# Patient Record
Sex: Female | Born: 1985 | Race: White | Hispanic: No | Marital: Married | Smoking: Former smoker
Health system: Southern US, Community
[De-identification: ages and names within clinical notes are randomized; demographics above are authoritative.]

## PROBLEM LIST (undated history)

## (undated) DIAGNOSIS — K8020 Calculus of gallbladder without cholecystitis without obstruction: Secondary | ICD-10-CM

## (undated) DIAGNOSIS — Z800 Family history of malignant neoplasm of digestive organs: Secondary | ICD-10-CM

## (undated) DIAGNOSIS — F329 Major depressive disorder, single episode, unspecified: Secondary | ICD-10-CM

## (undated) DIAGNOSIS — Z1379 Encounter for other screening for genetic and chromosomal anomalies: Principal | ICD-10-CM

## (undated) DIAGNOSIS — F32A Depression, unspecified: Secondary | ICD-10-CM

## (undated) DIAGNOSIS — Z87442 Personal history of urinary calculi: Secondary | ICD-10-CM

## (undated) DIAGNOSIS — E538 Deficiency of other specified B group vitamins: Secondary | ICD-10-CM

## (undated) HISTORY — DX: Major depressive disorder, single episode, unspecified: F32.9

## (undated) HISTORY — PX: WISDOM TOOTH EXTRACTION: SHX21

## (undated) HISTORY — DX: Encounter for other screening for genetic and chromosomal anomalies: Z13.79

## (undated) HISTORY — DX: Personal history of urinary calculi: Z87.442

## (undated) HISTORY — DX: Family history of malignant neoplasm of digestive organs: Z80.0

## (undated) HISTORY — DX: Depression, unspecified: F32.A

## (undated) HISTORY — DX: Calculus of gallbladder without cholecystitis without obstruction: K80.20

---

## 2007-02-14 ENCOUNTER — Inpatient Hospital Stay (HOSPITAL_COMMUNITY): Admission: AD | Admit: 2007-02-14 | Discharge: 2007-02-14 | Payer: Self-pay | Admitting: Obstetrics and Gynecology

## 2007-05-01 ENCOUNTER — Inpatient Hospital Stay (HOSPITAL_COMMUNITY): Admission: AD | Admit: 2007-05-01 | Discharge: 2007-05-03 | Payer: Self-pay | Admitting: Obstetrics and Gynecology

## 2010-07-10 ENCOUNTER — Emergency Department (HOSPITAL_COMMUNITY): Admission: EM | Admit: 2010-07-10 | Discharge: 2010-07-10 | Payer: Self-pay | Admitting: Emergency Medicine

## 2010-12-25 LAB — DIFFERENTIAL
Basophils Absolute: 0 10*3/uL (ref 0.0–0.1)
Lymphocytes Relative: 22 % (ref 12–46)
Monocytes Absolute: 0.9 10*3/uL (ref 0.1–1.0)
Monocytes Relative: 9 % (ref 3–12)
Neutro Abs: 6.4 10*3/uL (ref 1.7–7.7)
Neutrophils Relative %: 65 % (ref 43–77)

## 2010-12-25 LAB — URINALYSIS, ROUTINE W REFLEX MICROSCOPIC
Bilirubin Urine: NEGATIVE
Hgb urine dipstick: NEGATIVE
Ketones, ur: NEGATIVE mg/dL
Protein, ur: NEGATIVE mg/dL
Urobilinogen, UA: 0.2 mg/dL (ref 0.0–1.0)

## 2010-12-25 LAB — CBC
HCT: 37.3 % (ref 36.0–46.0)
Hemoglobin: 12.3 g/dL (ref 12.0–15.0)
WBC: 9.8 10*3/uL (ref 4.0–10.5)

## 2010-12-25 LAB — PREGNANCY, URINE: Preg Test, Ur: NEGATIVE

## 2010-12-25 LAB — BASIC METABOLIC PANEL
Calcium: 9.2 mg/dL (ref 8.4–10.5)
GFR calc Af Amer: >60 mL/min (ref >60)
GFR calc non Af Amer: >60 mL/min (ref >60)
Potassium: 3.6 mEq/L (ref 3.5–5.1)
Sodium: 140 mEq/L (ref 135–145)

## 2010-12-25 LAB — URINE MICROSCOPIC-ADD ON

## 2011-02-27 NOTE — Discharge Summary (Signed)
NAMESARAYAH, BACCHI                ACCOUNT NO.:  [id number]   MEDICAL RECORD NO.:  [id number]          PATIENT TYPE:  INP   LOCATION:  9147                          FACILITY:  [facility]   PHYSICIAN:  Zenaida Niece, M.D.DATE OF BIRTH:  June 17, 1986   DATE OF ADMISSION:  05/01/2007  DATE OF DISCHARGE:  05/03/2007                               DISCHARGE SUMMARY   ADMISSION DIAGNOSIS:  Intrauterine pregnancy at 39 weeks.   DISCHARGE DIAGNOSIS:  Intrauterine pregnancy at 39 weeks.   PROCEDURES:  On July 20, she had a spontaneous vaginal delivery.   HISTORY AND PHYSICAL:  This is a 25 year old white female gravida 2,  para 0-0-1-0 with an EGA of [redacted] weeks who presents with complaint of  regular contractions.  Evaluation in triage revealed cervix to be 3 cm  dilated with regular contractions.  Prenatal care essentially  uncomplicated.  She did briefly take Celexa for depression.   PRENATAL LABS:  Blood type is O negative with a negative antibody  screen, RPR nonreactive, rubella immune, hepatitis B surface antigen  negative, HIV negative, gonorrhea and chlamydia negative.  Quad screen  is normal, 1-hour Glucola 96, group B strep is negative.   PAST OB HISTORY:  One elective termination.   PAST MEDICAL HISTORY:  Depression.   PAST SURGICAL HISTORY:  Wisdom tooth removal.   PHYSICAL EXAM:  VITAL SIGNS:  She is afebrile with stable vital signs.  Fetal heart tracing reactive with contractions every 3 minutes.  ABDOMEN:  Gravid, nontender with an estimated fetal weight 7 pounds.  PELVIC:  Cervix on my first exam she is 7, complete and 0 with a vertex  presentation, adequate pelvis and amniotomy reveals clear fluid.   HOSPITAL COURSE:  The patient was admitted and continued to contract on  her own.  Amniotomy was done for augmentation.  She progressed to  complete, pushed well and on the afternoon of July 20 delivered a viable  female infant with Apgars of 9 and 9, weight 7 pounds 3 ounces.  Placenta delivered spontaneously intact and was sent for cord blood  collection.  She had bilateral labial and first-degree lacerations  repaired with 3-0 Vicryl and estimated blood loss was less than 500 mL.  Postpartum she had no significant complications.  Predelivery hemoglobin  11.3, postdelivery 10.2.  On postpartum #2 she was felt to be stable  enough for discharge home.   DISCHARGE INSTRUCTIONS:  Regular diet, pelvic rest, follow-up in 6  weeks.   DISCHARGE MEDICATIONS:  Over-the-counter ibuprofen as needed.   She is given our discharge pamphlet.      Zenaida Niece, M.D.  Electronically Signed     TDM/MEDQ  D:  05/29/2007  T:  05/30/2007  Job:  [id number]

## 2011-07-27 LAB — CBC
HCT: 29.3 — ABNORMAL LOW
HCT: 32.9 — ABNORMAL LOW
Hemoglobin: 11.3 — ABNORMAL LOW
MCHC: 34.3
MCHC: 34.8
MCV: 89.2
RBC: 3.28 — ABNORMAL LOW
RBC: 3.67 — ABNORMAL LOW
RDW: 14.1 — ABNORMAL HIGH
WBC: 16.3 — ABNORMAL HIGH

## 2011-07-27 LAB — RH IMMUNE GLOB WKUP(>/=20WKS)(NOT WOMEN'S HOSP)

## 2011-07-27 LAB — CCBB MATERNAL DONOR DRAW

## 2012-09-28 ENCOUNTER — Encounter: Payer: Self-pay | Admitting: Family

## 2012-09-28 ENCOUNTER — Ambulatory Visit (INDEPENDENT_AMBULATORY_CARE_PROVIDER_SITE_OTHER): Payer: Managed Care, Other (non HMO) | Admitting: Family

## 2012-09-28 ENCOUNTER — Telehealth: Payer: Self-pay | Admitting: Family

## 2012-09-28 ENCOUNTER — Ambulatory Visit (HOSPITAL_BASED_OUTPATIENT_CLINIC_OR_DEPARTMENT_OTHER)
Admission: RE | Admit: 2012-09-28 | Discharge: 2012-09-28 | Disposition: A | Discharge status: Home / Self Care | Payer: Managed Care, Other (non HMO) | Source: Ambulatory Visit | Attending: Family | Admitting: Family

## 2012-09-28 VITALS — BP 106/74 | HR 74 | Temp 98.2°F | Resp 16 | Ht 65.5 in | Wt 140.0 lb

## 2012-09-28 DIAGNOSIS — R1011 Right upper quadrant pain: Secondary | ICD-10-CM | POA: Insufficient documentation

## 2012-09-28 DIAGNOSIS — F32A Depression, unspecified: Secondary | ICD-10-CM

## 2012-09-28 DIAGNOSIS — K8020 Calculus of gallbladder without cholecystitis without obstruction: Secondary | ICD-10-CM

## 2012-09-28 DIAGNOSIS — F329 Major depressive disorder, single episode, unspecified: Secondary | ICD-10-CM

## 2012-09-28 DIAGNOSIS — R52 Pain, unspecified: Secondary | ICD-10-CM

## 2012-09-28 DIAGNOSIS — R109 Unspecified abdominal pain: Secondary | ICD-10-CM

## 2012-09-28 LAB — HEPATIC FUNCTION PANEL
AST: 12 U/L (ref 0–37)
Albumin: 4.3 g/dL (ref 3.5–5.2)
Bilirubin, Direct: 0.1 mg/dL (ref 0.0–0.3)
Total Bilirubin: 0.3 mg/dL (ref 0.3–1.2)

## 2012-09-28 LAB — BASIC METABOLIC PANEL WITH GFR
BUN: 7 mg/dL (ref 6–23)
CO2: 26 mEq/L (ref 19–32)
Chloride: 104 mEq/L (ref 96–112)
Creat: 0.67 mg/dL (ref 0.50–1.10)
GFR, Est Non African American: >89 mL/min
Potassium: 4.4 mEq/L (ref 3.5–5.3)

## 2012-09-28 LAB — CBC WITH DIFFERENTIAL/PLATELET
Basophils Absolute: 0 10*3/uL (ref 0.0–0.1)
Eosinophils Relative: 2 % (ref 0–5)
HCT: 35.9 % — ABNORMAL LOW (ref 36.0–46.0)
Hemoglobin: 12.4 g/dL (ref 12.0–15.0)
Lymphocytes Relative: 24 % (ref 12–46)
Lymphs Abs: 2.3 10*3/uL (ref 0.7–4.0)
MCV: 88.2 fL (ref 78.0–100.0)
Monocytes Absolute: 0.8 10*3/uL (ref 0.1–1.0)
Monocytes Relative: 9 % (ref 3–12)
Neutro Abs: 6.3 10*3/uL (ref 1.7–7.7)
RBC: 4.07 MIL/uL (ref 3.87–5.11)
WBC: 9.7 10*3/uL (ref 4.0–10.5)

## 2012-09-28 MED ORDER — TRAMADOL HCL 50 MG PO TABS: 50.0000 mg | ORAL_TABLET | Freq: Three times a day (TID) | ORAL | Status: DC | PRN

## 2012-09-28 NOTE — Patient Instructions (Addendum)
Please complete your blood work prior to leaving. Schedule your abdominal ultrasound on the first floor.  Schedule a fasting physical at your convenience.

## 2012-09-28 NOTE — Progress Notes (Signed)
Subjective:    Patient ID: Tiffany Lyons, female    DOB: 1986-05-15, 26 y.o.   MRN: [id number]  HPI  Ms.  Petrakis is a 26 yr old female who presents today to establish care.  She presents today with chief complaint of right sided abdominal pain radiates to back and shoulder.  Pain has been present x 4 month.  Worse after eating and at night.  + nausea, vomitted a few times. Has lost 15 pounds.  She has tried otc tums, GAS x, zantac.   Depression- Started around age 40.  Reports that the last year she has been feeling good.  Glad to be back at work.   Kidney Stones-2 episodes- last >1 year ago.   Review of Systems  Constitutional: Positive for unexpected weight change.  HENT: Negative for hearing loss.   Eyes: Negative for visual disturbance.  Respiratory: Negative for cough.   Cardiovascular: Negative for leg swelling.  Gastrointestinal: Positive for vomiting, diarrhea and constipation.  Genitourinary: Negative for dysuria and frequency.  Musculoskeletal: Positive for back pain.  Skin: Negative for rash.  Neurological: Negative for headaches.  Hematological: Negative for adenopathy.  Psychiatric/Behavioral:       Denies anxiety   Past Medical History  Diagnosis Date  . Depression   . History of kidney stones     History   Social History  . Marital Status: Married    Spouse Name: N/A    Number of Children: N/A  . Years of Education: N/A   Occupational History  . Not on file.   Social History Main Topics  . Smoking status: Current Every Day Smoker -- 0.5 packs/day    Types: Cigarettes  . Smokeless tobacco: Never Used  . Alcohol Use: 4.0 oz/week    8 drink(s) per week  . Drug Use: Not on file  . Sexually Active: Not on file   Other Topics Concern  . 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  Wisdom tooth extraction     Family History  Problem Relation Age of Onset  . Arthritis Mother   . Cancer Father     colon  . Acute myelogenous leukemia Maternal Grandfather   . Cancer Paternal Grandfather     colon  . Diabetes Paternal Grandfather     No Known Allergies  Current Outpatient Prescriptions on File Prior to Visit  Medication Sig Dispense Refill  . Norgestimate-Eth Estradiol (ORTHO-CYCLEN, 28, PO) Take 1 tablet by mouth daily.        BP 106/74  Pulse 74  Temp 98.2 F (36.8 C) (Oral)  Resp 16  Ht 5' 5.5" (1.664 m)  Wt 140 lb 0.6 oz (63.522 kg)  BMI 22.95 kg/m2  SpO2 99%  LMP 09/07/2012      Objective:   Physical Exam  Constitutional: She is oriented to person, place, and time. She appears well-developed and well-nourished. No distress.  HENT:  Right Ear: Tympanic membrane and ear canal normal.  Left Ear: Tympanic membrane and ear canal normal.  Cardiovascular: Normal rate and regular rhythm.   No murmur heard. Pulmonary/Chest: Effort normal and breath sounds normal. No respiratory distress. She has no wheezes. She has no rales. She exhibits no tenderness.  Abdominal: Soft. Bowel sounds are normal. She exhibits no distension and no mass. There is  tenderness in the right upper quadrant and epigastric area. There is positive Murphy's sign. There is no rigidity, no rebound and no guarding.  Musculoskeletal: She exhibits no edema.  Neurological: She is alert and oriented to person, place, and time.  Skin: Skin is warm and dry.  Psychiatric: She has a normal mood and affect. Her behavior is normal. Judgment and thought content normal.          Assessment & Plan:

## 2012-09-28 NOTE — Assessment & Plan Note (Signed)
Currently stable off of meds.  

## 2012-09-28 NOTE — Telephone Encounter (Signed)
Reviewed [place]- cholelithiasis.  Will refer to surgery, add tramadol prn pain.  Pt advised re: results/plan and not to drive after taking tramadol, may use tylenol during day.  She is instructed to go to the ED if she develops severe/worsening abdominal pain, fever, or if unable to keep down PO's. She verbalizes understanding.

## 2012-09-28 NOTE — Assessment & Plan Note (Signed)
Suspect cholecystitis.  Will refer for abdominal US and obtain baseline lab work.  Further eval pending these results.

## 2012-09-29 ENCOUNTER — Telehealth: Payer: Self-pay | Admitting: Family

## 2012-09-29 NOTE — Telephone Encounter (Signed)
Received medical records from [facility] °

## 2012-10-06 ENCOUNTER — Ambulatory Visit (INDEPENDENT_AMBULATORY_CARE_PROVIDER_SITE_OTHER): Payer: Managed Care, Other (non HMO) | Admitting: General Surgery

## 2012-10-06 ENCOUNTER — Encounter (INDEPENDENT_AMBULATORY_CARE_PROVIDER_SITE_OTHER): Payer: Self-pay | Admitting: General Surgery

## 2012-10-06 VITALS — BP 106/74 | HR 70 | Temp 98.3°F | Resp 16 | Ht 65.0 in | Wt 140.2 lb

## 2012-10-06 DIAGNOSIS — K8020 Calculus of gallbladder without cholecystitis without obstruction: Secondary | ICD-10-CM

## 2012-10-06 NOTE — Progress Notes (Signed)
Patient ID: Tiffany Lyons, female   DOB: 27-Nov-1985, 26 y.o.   MRN: [id number]  No chief complaint on file.   HPI Tiffany Lyons is a 26 y.o. female.  With a 41mo h/o RUQPain after meals.  Pt c/o pain that radieates to her back at times.  Some nausea and emesis.  No diarrhea HPI  Past Medical History  Diagnosis Date  . Depression   . History of kidney stones     Past Surgical History  Procedure Date  . Wisdom tooth extraction     Family History  Problem Relation Age of Onset  . Arthritis Mother   . Cancer Father     colon  . Acute myelogenous leukemia Maternal Grandfather   . Cancer Paternal Grandfather     colon  . Diabetes Paternal Grandfather     Social History History  Substance Use Topics  . Smoking status: Current Every Day Smoker -- 0.5 packs/day    Types: Cigarettes  . Smokeless tobacco: Never Used  . Alcohol Use: 4.0 oz/week    8 drink(s) per week    No Known Allergies  Current Outpatient Prescriptions  Medication Sig Dispense Refill  . ibuprofen (ADVIL,MOTRIN) 200 MG tablet Take 600-800 mg by mouth daily.      [name] Estradiol (ORTHO-CYCLEN, 28, PO) Take 1 tablet by mouth daily.      . traMADol (ULTRAM) 50 MG tablet Take 1 tablet (50 mg total) by mouth every 8 (eight) hours as needed for pain.  30 tablet  0    Review of Systems Review of Systems  Blood pressure 106/74, pulse 70, temperature 98.3 F (36.8 C), temperature source Temporal, resp. rate 16, height 5\' 5"  (1.651 m), weight 140 lb 3.2 oz (63.594 kg), last menstrual period 09/07/2012.  Physical Exam Physical Exam  Data Reviewed [place] with gallstones  Assessment    26 y/o F with sx gallstones.    Plan    1.  To OR for Lap chole with IOC 2. All risks and benefits were discussed with the patient, to generally include infection, bleeding, damage to surrounding structures, and recurrence. Alternatives were offered and described.  All questions were answered and the patient  voiced understanding of the procedure and wishes to proceed at this point.        Marigene Ehlers., Uzziah Rigg 10/06/2012, 10:19 AM

## 2012-10-17 ENCOUNTER — Telehealth (INDEPENDENT_AMBULATORY_CARE_PROVIDER_SITE_OTHER): Payer: Self-pay | Admitting: General Surgery

## 2012-10-17 ENCOUNTER — Other Ambulatory Visit (INDEPENDENT_AMBULATORY_CARE_PROVIDER_SITE_OTHER): Payer: Self-pay | Admitting: General Surgery

## 2012-10-17 DIAGNOSIS — K8010 Calculus of gallbladder with chronic cholecystitis without obstruction: Secondary | ICD-10-CM

## 2012-10-17 HISTORY — PX: CHOLECYSTECTOMY: SHX55

## 2012-10-17 NOTE — Telephone Encounter (Signed)
LMOM letting pt know her first PO will be on 1/22 at 4:50

## 2012-10-19 ENCOUNTER — Telehealth (INDEPENDENT_AMBULATORY_CARE_PROVIDER_SITE_OTHER): Payer: Self-pay | Admitting: General Surgery

## 2012-10-19 ENCOUNTER — Encounter (INDEPENDENT_AMBULATORY_CARE_PROVIDER_SITE_OTHER): Payer: Self-pay | Admitting: General Surgery

## 2012-10-19 NOTE — Telephone Encounter (Signed)
Called patient to let her know that I faxed over RTW note to the attn: Rosie Fate and I sent a staff message to Dr Derrell Lolling to let him know that I sent a note to her work and I asked her if she do not take any pain meds when she drives

## 2012-10-19 NOTE — Telephone Encounter (Signed)
Pt called to request a RTW letter for Monday.  Her surgery was on 10/17/12; lap chole.  She has not discussed with her surgeon returning to work after only 7 days, so understands will need to get the OK from Dr. Derrell Lolling first.  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  This involves no lifting, carrying, pushing or pulling.  FAX note to attn:  Rosie Fate at [phone].

## 2012-10-25 ENCOUNTER — Encounter (INDEPENDENT_AMBULATORY_CARE_PROVIDER_SITE_OTHER): Payer: Self-pay | Admitting: General Surgery

## 2012-11-02 ENCOUNTER — Encounter (INDEPENDENT_AMBULATORY_CARE_PROVIDER_SITE_OTHER): Payer: BC Managed Care – PPO | Admitting: General Surgery

## 2012-11-02 ENCOUNTER — Ambulatory Visit (INDEPENDENT_AMBULATORY_CARE_PROVIDER_SITE_OTHER): Payer: BC Managed Care – PPO | Admitting: General Surgery

## 2012-11-02 ENCOUNTER — Encounter (INDEPENDENT_AMBULATORY_CARE_PROVIDER_SITE_OTHER): Payer: Self-pay | Admitting: General Surgery

## 2012-11-02 VITALS — BP 112/62 | HR 95 | Temp 97.9°F | Ht 65.0 in | Wt 132.6 lb

## 2012-11-02 DIAGNOSIS — Z9889 Other specified postprocedural states: Secondary | ICD-10-CM

## 2012-11-02 DIAGNOSIS — Z9049 Acquired absence of other specified parts of digestive tract: Secondary | ICD-10-CM

## 2012-11-02 NOTE — Progress Notes (Signed)
Patient ID: Tiffany Lyons, female   DOB: 02-06-1986, 27 y.o.   MRN: [id number] The patient's 27 year old female status post laparoscopic cholecystectomy. Patient has been doing well postoperatively. Her preoperative symptomatology has resolved. Patient said normal bowel movements at this time without any diarrhea.  On exam: Wound clean dry and intact.  Pathology demonstrated chronic cholecystitis with cholelithiasis this was discussed with the patient.  Assessment and plan: Patient follow up when necessary. We discussed weight restrictions for another 3-4 weeks.

## 2013-06-14 ENCOUNTER — Encounter: Payer: Managed Care, Other (non HMO) | Admitting: Family

## 2013-07-17 ENCOUNTER — Encounter: Payer: Self-pay | Admitting: Family

## 2013-07-17 ENCOUNTER — Telehealth (INDEPENDENT_AMBULATORY_CARE_PROVIDER_SITE_OTHER): Payer: BC Managed Care – PPO | Admitting: Family

## 2013-07-17 ENCOUNTER — Other Ambulatory Visit (HOSPITAL_COMMUNITY)
Admission: RE | Admit: 2013-07-17 | Discharge: 2013-07-17 | Disposition: A | Discharge status: Home / Self Care | Payer: BC Managed Care – PPO | Source: Ambulatory Visit | Attending: Family | Admitting: Family

## 2013-07-17 VITALS — BP 120/80 | HR 84 | Temp 98.7°F | Resp 16 | Ht 64.0 in | Wt 142.0 lb

## 2013-07-17 DIAGNOSIS — Z111 Encounter for screening for respiratory tuberculosis: Secondary | ICD-10-CM

## 2013-07-17 DIAGNOSIS — Z01419 Encounter for gynecological examination (general) (routine) without abnormal findings: Secondary | ICD-10-CM

## 2013-07-17 DIAGNOSIS — Z0000 Encounter for general adult medical examination without abnormal findings: Secondary | ICD-10-CM

## 2013-07-17 MED ORDER — NORGESTIMATE-ETH ESTRADIOL 0.25-35 MG-MCG PO TABS: 1.0000 | ORAL_TABLET | Freq: Every day | ORAL | Status: DC

## 2013-07-17 NOTE — Patient Instructions (Addendum)
Please complete lab work prior to leaving. Follow up on Wednesday for PPD reading. Follow up in 1 year for a complete physical. Follow up sooner if problems/concerns.

## 2013-07-17 NOTE — Addendum Note (Signed)
Addended by: Mervin Kung A on: 07/17/2013 06:04 PM   Modules accepted: Orders

## 2013-07-17 NOTE — Progress Notes (Signed)
Subjective:    Patient ID: Tiffany Lyons, female    DOB: 16-Sep-1986, 27 y.o.   MRN: [id number]  HPI  Patient presents today for complete physical.  Immunizations: tetanus and flu shot up to date.   Diet:reports that her diet has improved.   Exercise: Pap Smear: 03/2012 Smoking has weaned from 24 mcg down to 3 on the e cig.  Plans to quit entirely in the next few weeks   Review of Systems  Constitutional: Negative for unexpected weight change.  HENT: Negative for hearing loss and congestion.   Eyes: Negative for visual disturbance.  Respiratory: Negative for cough and shortness of breath.   Cardiovascular: Negative for chest pain and leg swelling.  Gastrointestinal: Negative for vomiting, diarrhea and constipation.  Genitourinary: Negative for dysuria, frequency and menstrual problem.  Musculoskeletal: Negative for myalgias and arthralgias.  Skin: Negative for rash.  Neurological: Negative for headaches.  Hematological: Negative for adenopathy.  Psychiatric/Behavioral:       Denies depression/anxiety   Past Medical History  Diagnosis Date  . Depression   . History of kidney stones     History   Social History  . Marital Status: Married    Spouse Name: N/A    Number of Children: N/A  . Years of Education: N/A   Occupational History  . Not on file.   Social History Main Topics  . Smoking status: Current Every Day Smoker    Types: Cigarettes  . Smokeless tobacco: Never Used     Comment: Using vapor cigarettes.  . Alcohol Use: 4.0 oz/week    8 drink(s) per week  . Drug Use: Not on file  . Sexual Activity: Not on file   Other Topics Concern  . Not on file   Social History Narrative   Education administrator at [employer]   Patient is Married   She has a 109 year old son- born 2008   She completed HS- enjoys reading, crocheting             Past Surgical History  Procedure Laterality Date  . Wisdom tooth extraction    . Cholecystectomy  10/17/12    lap chole     Family History  Problem Relation Age of Onset  . Arthritis Mother   . Cancer Father     colon  . Acute myelogenous leukemia Maternal Grandfather   . Cancer Paternal Grandfather     colon  . Diabetes Paternal Grandfather     No Known Allergies  Current Outpatient Prescriptions on File Prior to Visit  Medication Sig Dispense Refill  . Norgestimate-Eth Estradiol (ORTHO-CYCLEN, 28, PO) Take 1 tablet by mouth daily.       No current facility-administered medications on file prior to visit.    BP 120/80  Pulse 84  Temp(Src) 98.7 F (37.1 C) (Oral)  Resp 16  Ht 5\' 4"  (1.626 m)  Wt 142 lb 0.6 oz (64.429 kg)  BMI 24.37 kg/m2  SpO2 99%       Objective:   Physical Exam  Physical Exam  Constitutional: She is oriented to person, place, and time. She appears well-developed and well-nourished. No distress.  HENT:  Head: Normocephalic and atraumatic.  Right Ear: Tympanic membrane and ear canal normal.  Left Ear: Tympanic membrane and ear canal normal.  Mouth/Throat: Oropharynx is clear and moist.  Eyes: Pupils are equal, round, and reactive to light. No scleral icterus.  Neck: Normal range of motion. No thyromegaly present.  Cardiovascular: Normal rate and regular  rhythm.   No murmur heard. Pulmonary/Chest: Effort normal and breath sounds normal. No respiratory distress. He has no wheezes. She has no rales. She exhibits no tenderness.  Abdominal: Soft. Bowel sounds are normal. He exhibits no distension and no mass. There is no tenderness. There is no rebound and no guarding.  Musculoskeletal: She exhibits no edema.  Lymphadenopathy:    She has no cervical adenopathy.  Neurological: She is alert and oriented to person, place, and time.She exhibits normal muscle tone. Coordination normal.  Skin: Skin is warm and dry. multiple tattoos noted Psychiatric: She has a normal mood and affect. Her behavior is normal. Judgment and thought content normal.  Breasts: Examined  lying Right: Without masses, retractions, discharge or axillary adenopathy.  Left: Without masses, retractions, discharge or axillary adenopathy.  Inguinal/mons: Normal without inguinal adenopathy  External genitalia: Normal  BUS/Urethra/Skene's glands: Normal  Bladder: Normal  Vagina: Normal  Cervix: Normal  Uterus: normal in size, shape and contour. Midline and mobile  Adnexa/parametria:  Rt: Without masses or tenderness.  Lt: Without masses or tenderness.  Anus and perineum: Normal           Assessment & Plan:         Assessment & Plan:

## 2013-07-17 NOTE — Assessment & Plan Note (Signed)
I encouraged complete smoking cessation.  PPD placement today. Fasting labs today. Pap performed today.

## 2013-07-19 ENCOUNTER — Telehealth: Payer: Self-pay | Admitting: Family

## 2013-07-19 DIAGNOSIS — Z0000 Encounter for general adult medical examination without abnormal findings: Secondary | ICD-10-CM

## 2013-07-19 LAB — CBC WITH DIFFERENTIAL/PLATELET
Eosinophils Relative: 1 % (ref 0–5)
HCT: 33.8 % — ABNORMAL LOW (ref 36.0–46.0)
Hemoglobin: 11.7 g/dL — ABNORMAL LOW (ref 12.0–15.0)
Lymphocytes Relative: 30 % (ref 12–46)
MCHC: 34.6 g/dL (ref 30.0–36.0)
MCV: 88.7 fL (ref 78.0–100.0)
Monocytes Absolute: 0.6 10*3/uL (ref 0.1–1.0)
Monocytes Relative: 8 % (ref 3–12)
Neutro Abs: 4.7 10*3/uL (ref 1.7–7.7)
WBC: 7.5 10*3/uL (ref 4.0–10.5)

## 2013-07-19 LAB — LIPID PANEL
LDL Cholesterol: 52 mg/dL (ref 0–99)
VLDL: 12 mg/dL (ref 0–40)

## 2013-07-19 LAB — TB SKIN TEST: TB Skin Test: NEGATIVE

## 2013-07-19 LAB — URINALYSIS, ROUTINE W REFLEX MICROSCOPIC
Leukocytes, UA: NEGATIVE
Nitrite: NEGATIVE
Protein, ur: NEGATIVE mg/dL
Urobilinogen, UA: 0.2 mg/dL (ref 0.0–1.0)

## 2013-07-19 LAB — HEPATIC FUNCTION PANEL
ALT: 14 U/L (ref 0–35)
AST: 17 U/L (ref 0–37)
Bilirubin, Direct: 0.1 mg/dL (ref 0.0–0.3)
Indirect Bilirubin: 0.4 mg/dL (ref 0.0–0.9)

## 2013-07-19 LAB — BASIC METABOLIC PANEL WITH GFR
BUN: 9 mg/dL (ref 6–23)
CO2: 24 mEq/L (ref 19–32)
Chloride: 106 mEq/L (ref 96–112)
GFR, Est Non African American: >89 mL/min
Potassium: 3.9 mEq/L (ref 3.5–5.3)

## 2013-07-19 NOTE — Telephone Encounter (Signed)
Pt came in to the office, did not get previous message from [place]. Does not have immunization record but states she was in [employer] and feels like immunizations are up to date. Advised pt of need to check titers and she voices understanding.   Tiffany Lyons, can you check the immunization database as well?

## 2013-07-19 NOTE — Telephone Encounter (Signed)
Please remind pt that she needs to follow up this afternoon or tomorrow AM for PPD reading.

## 2013-07-19 NOTE — Telephone Encounter (Signed)
Left message on home phone.  Also sent to mychart- see message. See pended labs below in case she does not have her immunization records.

## 2013-07-20 LAB — RUBELLA SCREEN: Rubella: 6.09 Index — ABNORMAL HIGH (ref <0.90)

## 2013-07-20 LAB — TSH: TSH: 1.118 u[IU]/mL (ref 0.350–4.500)

## 2013-07-20 NOTE — Telephone Encounter (Signed)
Copy also put on Tricia's desk

## 2013-07-20 NOTE — Telephone Encounter (Signed)
Pt has had 3 hep B's, 1 MMR and 1 td. Immunizations updated in pts chart

## 2013-07-21 ENCOUNTER — Encounter: Payer: Self-pay | Admitting: Family

## 2013-08-07 ENCOUNTER — Encounter: Payer: Self-pay | Admitting: Family

## 2013-08-07 ENCOUNTER — Ambulatory Visit (INDEPENDENT_AMBULATORY_CARE_PROVIDER_SITE_OTHER): Payer: BC Managed Care – PPO | Admitting: Family

## 2013-08-07 VITALS — BP 106/72 | HR 92 | Temp 100.6°F | Resp 16 | Ht 65.5 in | Wt 149.0 lb

## 2013-08-07 DIAGNOSIS — R509 Fever, unspecified: Secondary | ICD-10-CM

## 2013-08-07 DIAGNOSIS — J029 Acute pharyngitis, unspecified: Secondary | ICD-10-CM

## 2013-08-07 LAB — POCT INFLUENZA A/B
Influenza A, POC: NEGATIVE
Influenza B, POC: NEGATIVE

## 2013-08-07 MED ORDER — LIDOCAINE VISCOUS 2 % MT SOLN: 15.0000 mL | OROMUCOSAL | Status: DC | PRN

## 2013-08-07 NOTE — Progress Notes (Signed)
Subjective:    Patient ID: Tiffany Lyons, female    DOB: 09-10-1986, 27 y.o.   MRN: [id number]  HPI  Tiffany Lyons is a 27 yr old female who presents today with chief complaint of sore throat.  Sore throat started 3 days ago. Reports associated bilateral ear pain since yesterday.  Tmax 101.9 three hours ago. Took tylenol.  Mild associated cough, nasal drainage.  Denies associated nausea.  Works in the school system.    Review of Systems See HPI  Past Medical History  Diagnosis Date  . Depression   . History of kidney stones     History   Social History  . Marital Status: Married    Spouse Name: N/A    Number of Children: N/A  . Years of Education: N/A   Occupational History  . Not on file.   Social History Main Topics  . Smoking status: Former Smoker    Types: Cigarettes    Quit date: 07/31/2013  . Smokeless tobacco: Never Used     Comment: Using vapor cigarettes.  . Alcohol Use: 4.0 oz/week    8 drink(s) per week  . Drug Use: Not on file  . Sexual Activity: Not on file   Other Topics Concern  . Not on file   Social History Narrative   Education administrator at [employer]   Patient is Married   She has a 4 year old son- born 2008   She completed HS- enjoys reading, crocheting             Past Surgical History  Procedure Laterality Date  . Wisdom tooth extraction    . Cholecystectomy  10/17/12    lap chole    Family History  Problem Relation Age of Onset  . Arthritis Mother   . Cancer Father     colon  . Acute myelogenous leukemia Maternal Grandfather   . Cancer Paternal Grandfather     colon  . Diabetes Paternal Grandfather     No Known Allergies  Current Outpatient Prescriptions on File Prior to Visit  Medication Sig Dispense Refill  . norgestimate-ethinyl estradiol (ORTHO-CYCLEN,SPRINTEC,PREVIFEM) 0.25-35 MG-MCG tablet Take 1 tablet by mouth daily.  1 Package  11   No current facility-administered medications on file prior to visit.    BP 106/72   Pulse 92  Temp(Src) 100.6 F (38.1 C) (Oral)  Resp 16  Ht 5' 5.5" (1.664 m)  Wt 149 lb 0.6 oz (67.604 kg)  BMI 24.42 kg/m2  SpO2 99%  LMP 07/21/2013       Objective:   Physical Exam  Constitutional: She is oriented to person, place, and time.  Ill appearing white female in NAD  HENT:  Head: Normocephalic and atraumatic.  Right Ear: Tympanic membrane and ear canal normal.  Left Ear: Tympanic membrane and ear canal normal.  Mouth/Throat: Posterior oropharyngeal erythema present. No oropharyngeal exudate or posterior oropharyngeal edema.  Eyes: No scleral icterus.  Cardiovascular: Normal rate and regular rhythm.   No murmur heard. Pulmonary/Chest: Effort normal and breath sounds normal. No respiratory distress. She has no wheezes. She has no rales. She exhibits no tenderness.  Musculoskeletal: She exhibits no edema.  Lymphadenopathy:    She has cervical adenopathy.  Neurological: She is alert and oriented to person, place, and time.  Skin: Skin is warm and dry.  Face is flushed  Psychiatric: She has a normal mood and affect. Her behavior is normal. Judgment and thought content normal.  Assessment & Plan:

## 2013-08-07 NOTE — Patient Instructions (Signed)
Drink plenty of fluids. Call if symptoms worsen, if persistent fever >101, or if not improved in 2-3 days. You may use ibuprofen 400-600mg  every 6 hours as needed for pain for the next few days. You may uses viscous lidocaine as needed to help with throat pain.

## 2013-08-08 ENCOUNTER — Encounter: Payer: Self-pay | Admitting: Family

## 2013-08-08 DIAGNOSIS — J029 Acute pharyngitis, unspecified: Secondary | ICD-10-CM | POA: Insufficient documentation

## 2013-08-08 LAB — STREP A DNA PROBE: GASP: NEGATIVE

## 2013-08-08 NOTE — Assessment & Plan Note (Signed)
Strep testing negative.  Supportive measures, ibuprofen prn, viscous lidocaine prn.  Follow up if worseing.

## 2013-11-21 ENCOUNTER — Ambulatory Visit (INDEPENDENT_AMBULATORY_CARE_PROVIDER_SITE_OTHER): Payer: BC Managed Care – PPO | Admitting: Family Medicine

## 2013-11-21 ENCOUNTER — Encounter: Payer: Self-pay | Admitting: Family Medicine

## 2013-11-21 VITALS — BP 112/70 | HR 77 | Temp 98.6°F | Wt 152.0 lb

## 2013-11-21 DIAGNOSIS — H6591 Unspecified nonsuppurative otitis media, right ear: Secondary | ICD-10-CM

## 2013-11-21 DIAGNOSIS — H6590 Unspecified nonsuppurative otitis media, unspecified ear: Secondary | ICD-10-CM

## 2013-11-21 MED ORDER — CEFUROXIME AXETIL 500 MG PO TABS: 500.0000 mg | ORAL_TABLET | Freq: Two times a day (BID) | ORAL | Status: AC

## 2013-11-21 NOTE — Progress Notes (Signed)
  Subjective:     Tiffany Lyons is a 28 y.o. female who presents for evaluation of right ear pain. Symptoms have been present for 1 week. She also notes a plugged sensation in the right ear. She does not have a history of ear infections. She does not have a history of recent swimming.  The patient's history has been marked as reviewed and updated as appropriate.   Review of Systems Pertinent items are noted in HPI.   Objective:    BP 112/70  Pulse 77  Temp(Src) 98.6 F (37 C) (Oral)  Wt 152 lb (68.947 kg)  SpO2 99% General:  alert, cooperative, appears stated age and no distress  Right Ear: right canal bulging and red  Left Ear: normal appearance  Mouth:  lips, mucosa, and tongue normal; teeth and gums normal  Neck: no adenopathy, no carotid bruit, no JVD, supple, symmetrical, trachea midline and thyroid not enlarged, symmetric, no tenderness/mass/nodules       Assessment:    Right otitis media --serous   Plan:    Treatment: ceftin.---use otc nasocort or flonase and antihistamine OTC analgesia as needed. Water exclusion from affected ear until symptoms resolve. Follow up in a few days if symptoms not improving.

## 2013-11-21 NOTE — Progress Notes (Signed)
Pre visit review using our clinic review tool, if applicable. No additional management support is needed unless otherwise documented below in the visit note. 

## 2013-11-21 NOTE — Patient Instructions (Signed)
Serous Otitis Media  Serous otitis media is fluid in the middle ear space. This space contains the bones for hearing and air. Air in the middle ear space helps to transmit sound.  The air gets there through the eustachian tube. This tube goes from the back of the nose (nasopharynx) to the middle ear space. It keeps the pressure in the middle ear the same as the outside world. It also helps to drain fluid from the middle ear space. CAUSES  Serous otitis media occurs when the eustachian tube gets blocked. Blockage can come from:  Ear infections.  Colds and other upper respiratory infections.  Allergies.  Irritants such as cigarette smoke.  Sudden changes in air pressure (such as descending in an airplane).  Enlarged adenoids.  A mass in the nasopharynx. During colds and upper respiratory infections, the middle ear space can become temporarily filled with fluid. This can happen after an ear infection also. Once the infection clears, the fluid will generally drain out of the ear through the eustachian tube. If it does not, then serous otitis media occurs. SIGNS AND SYMPTOMS   Hearing loss.  A feeling of fullness in the ear, without pain.  Young children may not show any symptoms but may show slight behavioral changes, such as agitation, ear pulling, or crying. DIAGNOSIS  Serous otitis media is diagnosed by an ear exam. Tests may be done to check on the movement of the eardrum. Hearing exams may also be done. TREATMENT  The fluid most often goes away without treatment. If allergy is the cause, allergy treatment may be helpful. Fluid that persists for several months may require minor surgery. A small tube is placed in the eardrum to:  Drain the fluid.  Restore the air in the middle ear space. In certain situations, antibiotics are used to avoid surgery. Surgery may be done to remove enlarged adenoids (if this is the cause). HOME CARE INSTRUCTIONS   Keep children away from tobacco  smoke.  Be sure to keep any follow-up appointments. SEEK MEDICAL CARE IF:   Your hearing is not better in 3 months.  Your hearing is worse.  You have ear pain.  You have drainage from the ear.  You have dizziness.  You have serous otitis media only in one ear or have any bleeding from your nose (epistaxis).  You notice a lump on your neck. MAKE SURE YOU:  Understand these instructions.   Will watch your condition.   Will get help right away if you are not doing well or get worse.  Document Released: 12/19/2003 Document Revised: 05/31/2013 Document Reviewed: 04/25/2013 Exit[employer] Patient Information 2014 [employer].  

## 2014-07-04 ENCOUNTER — Telehealth: Payer: Self-pay

## 2014-07-04 MED ORDER — NORGESTIMATE-ETH ESTRADIOL 0.25-35 MG-MCG PO TABS: 1.0000 | ORAL_TABLET | Freq: Every day | ORAL | Status: DC

## 2014-07-04 NOTE — Telephone Encounter (Signed)
Rx request to pharmacy/SLS  

## 2014-07-04 NOTE — Telephone Encounter (Signed)
Tiffany Lyons Self [phone] [facility] Jerilynn Mages)  Terasa called and made a appointment for her CPE, but she will run out of norgestimate-ethinyl estradiol (ORTHO-CYCLEN,SPRINTEC,PREVIFEM) 0.25-35 MG-MCG tablet, before her appointments

## 2014-07-20 ENCOUNTER — Other Ambulatory Visit: Payer: Self-pay | Admitting: Family

## 2014-07-30 ENCOUNTER — Ambulatory Visit (INDEPENDENT_AMBULATORY_CARE_PROVIDER_SITE_OTHER): Payer: BC Managed Care – PPO | Admitting: Family

## 2014-07-30 ENCOUNTER — Encounter: Payer: Self-pay | Admitting: Family

## 2014-07-30 VITALS — BP 110/70 | HR 87 | Temp 98.8°F | Resp 16 | Ht 65.5 in | Wt 154.2 lb

## 2014-07-30 DIAGNOSIS — D6489 Other specified anemias: Secondary | ICD-10-CM

## 2014-07-30 DIAGNOSIS — Z0000 Encounter for general adult medical examination without abnormal findings: Secondary | ICD-10-CM

## 2014-07-30 MED ORDER — NORGESTIMATE-ETH ESTRADIOL 0.25-35 MG-MCG PO TABS: ORAL_TABLET | ORAL | Status: DC

## 2014-07-30 NOTE — Patient Instructions (Signed)
Please complete lab work prior to leaving. Follow up in 1 year, sooner if problems or concerns.

## 2014-07-30 NOTE — Progress Notes (Signed)
Subjective:    Patient ID: Tiffany Lyons, female    DOB: 06-May-1986, 28 y.o.   MRN: [id number]  HPI  Patient presents today for complete physical.  Immunizations: up to date Diet: reports that diet could be better, drinks too much soda  Exercise: started walking daily for for 1 hour or more Dental: she is not up to date- will schedule Eye exam: last exam was 2 years ago- wears reading glasses for computer screen Pap Smear: up to date  Mammogram: Wt Readings from Last 3 Encounters:  07/30/14 154 lb 3.2 oz (69.945 kg)  11/21/13 152 lb (68.947 kg)  08/07/13 149 lb 0.6 oz (67.604 kg)  quit nicotine/4 months ag      Review of Systems  Constitutional: Negative for unexpected weight change.  HENT: Negative for hearing loss and rhinorrhea.   Eyes: Negative for visual disturbance.  Respiratory: Negative for cough and shortness of breath.   Cardiovascular: Negative for chest pain.  Gastrointestinal: Negative for nausea, vomiting and diarrhea.  Genitourinary: Negative for dysuria, frequency and hematuria.  Musculoskeletal: Negative for arthralgias and myalgias.  Skin: Negative for rash.  Neurological:       Reports occasional headaches  Hematological: Negative for adenopathy.  Psychiatric/Behavioral:       Denies depression/anxiety   Past Medical History  Diagnosis Date  . Depression   . History of kidney stones     History   Social History  . Marital Status: Married    Spouse Name: N/A    Number of Children: N/A  . Years of Education: N/A   Occupational History  . Not on file.   Social History Main Topics  . Smoking status: Former Smoker    Types: Cigarettes    Quit date: 07/31/2013  . Smokeless tobacco: Never Used     Comment: Using vapor cigarettes.  . Alcohol Use: 4.0 oz/week    8 drink(s) per week  . Drug Use: Not on file  . Sexual Activity: Not on file   Other Topics Concern  . 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 Wisdom tooth extraction    . Cholecystectomy  10/17/12    lap chole    Family History  Problem Relation Age of Onset  . Arthritis Mother   . Cancer Father     colon  . Acute myelogenous leukemia Maternal Grandfather   . Cancer Paternal Grandfather     colon  . Diabetes Paternal Grandfather   . Hemophilia Mother     No Known Allergies  No current outpatient prescriptions on file prior to visit.   No current facility-administered medications on file prior to visit.    BP 110/70  Pulse 87  Temp(Src) 98.8 F (37.1 C) (Oral)  Resp 16  Ht 5' 5.5" (1.664 m)  Wt 154 lb 3.2 oz (69.945 kg)  BMI 25.26 kg/m2  SpO2 99%  LMP 07/17/2014       Objective:   Physical Exam Physical Exam  Constitutional: She is oriented to person, place, and time. She appears well-developed and well-nourished. No distress.  HENT:  Head: Normocephalic and atraumatic.  Right Ear: Tympanic membrane and ear  canal normal.  Left Ear: Tympanic membrane and ear canal normal.  Mouth/Throat: Oropharynx is clear and moist.  Eyes: Pupils are equal, round, and reactive to light. No scleral icterus.  Neck: Normal range of motion. No thyromegaly present.  Cardiovascular: Normal rate and regular rhythm.   No murmur heard. Pulmonary/Chest: Effort normal and breath sounds normal. No respiratory distress. He has no wheezes. She has no rales. She exhibits no tenderness.  Abdominal: Soft. Bowel sounds are normal. He exhibits no distension and no mass. There is no tenderness. There is no rebound and no guarding.  Musculoskeletal: She exhibits no edema.  Lymphadenopathy:    She has no cervical adenopathy.  Neurological: She is alert and oriented to person, place, and time. She exhibits normal muscle tone.  Coordination normal.  Skin: Skin is warm and dry.  Psychiatric: She has a normal mood and affect. Her behavior is normal. Judgment and thought content normal.  Breasts: Examined lying Right: Without masses, retractions, discharge or axillary adenopathy.  Left: Without masses, retractions, discharge or axillary adenopathy.    Assessment & Plan:          Assessment & Plan:

## 2014-07-30 NOTE — Progress Notes (Signed)
Pre visit review using our clinic review tool, if applicable. No additional management support is needed unless otherwise documented below in the visit note. 

## 2014-07-30 NOTE — Assessment & Plan Note (Signed)
We discussed healthy diet, exercise. Obtain follow up CBC/iron  to follow up on mild anemia.  TSH due to mild weight gain.

## 2014-07-30 NOTE — Addendum Note (Signed)
Addended by: Peggyann Shoals on: 07/30/2014 05:19 PM   Modules accepted: Orders

## 2014-07-31 LAB — CBC WITH DIFFERENTIAL/PLATELET
Basophils Absolute: 0 10*3/uL (ref 0.0–0.1)
Basophils Relative: 0.3 % (ref 0.0–3.0)
EOS PCT: 0.3 % (ref 0.0–5.0)
Eosinophils Absolute: 0 10*3/uL (ref 0.0–0.7)
HCT: 36.8 % (ref 36.0–46.0)
HEMOGLOBIN: 12.3 g/dL (ref 12.0–15.0)
Lymphocytes Relative: 22.1 % (ref 12.0–46.0)
Lymphs Abs: 2 10*3/uL (ref 0.7–4.0)
MCHC: 33.3 g/dL (ref 30.0–36.0)
MCV: 90.2 fl (ref 78.0–100.0)
MONO ABS: 0.6 10*3/uL (ref 0.1–1.0)
MONOS PCT: 6.2 % (ref 3.0–12.0)
NEUTROS ABS: 6.4 10*3/uL (ref 1.4–7.7)
Neutrophils Relative %: 71.1 % (ref 43.0–77.0)
Platelets: 278 10*3/uL (ref 150.0–400.0)
RBC: 4.08 Mil/uL (ref 3.87–5.11)
RDW: 13.5 % (ref 11.5–15.5)
WBC: 8.9 10*3/uL (ref 4.0–10.5)

## 2014-07-31 LAB — TSH: TSH: 0.63 u[IU]/mL (ref 0.35–4.50)

## 2014-07-31 LAB — IRON: Iron: 36 ug/dL — ABNORMAL LOW (ref 42–145)

## 2014-08-02 ENCOUNTER — Encounter: Payer: Self-pay | Admitting: Family

## 2014-08-02 DIAGNOSIS — E611 Iron deficiency: Secondary | ICD-10-CM | POA: Insufficient documentation

## 2014-08-13 ENCOUNTER — Encounter: Payer: Self-pay | Admitting: Family

## 2015-02-07 ENCOUNTER — Ambulatory Visit (INDEPENDENT_AMBULATORY_CARE_PROVIDER_SITE_OTHER): Payer: BC Managed Care – PPO | Admitting: Medical

## 2015-02-07 ENCOUNTER — Encounter: Payer: Self-pay | Admitting: Medical

## 2015-02-07 VITALS — BP 126/78 | HR 92 | Temp 99.1°F | Ht 65.5 in | Wt 151.8 lb

## 2015-02-07 DIAGNOSIS — J020 Streptococcal pharyngitis: Secondary | ICD-10-CM | POA: Diagnosis not present

## 2015-02-07 DIAGNOSIS — R52 Pain, unspecified: Secondary | ICD-10-CM

## 2015-02-07 DIAGNOSIS — J029 Acute pharyngitis, unspecified: Secondary | ICD-10-CM | POA: Insufficient documentation

## 2015-02-07 LAB — POCT INFLUENZA A/B
INFLUENZA B, POC: NEGATIVE
Influenza A, POC: NEGATIVE

## 2015-02-07 LAB — POCT RAPID STREP A (OFFICE): RAPID STREP A SCREEN: POSITIVE — AB

## 2015-02-07 MED ORDER — AZITHROMYCIN 250 MG PO TABS: ORAL_TABLET | ORAL | Status: DC

## 2015-02-07 NOTE — Addendum Note (Signed)
Addended by: Anabel Halon on: 02/07/2015 09:47 AM   Modules accepted: Miquel Dunn

## 2015-02-07 NOTE — Progress Notes (Signed)
Subjective:    Patient ID: Tiffany Lyons, female    DOB: Mar 14, 1986, 29 y.o.   MRN: [id number]  HPI   Pt in with sore throat for one day. Worst last night. Fever last night. Some body aches diffuse mild- moderate. Pt has 67 yo child. Pt work school and drives a school bus. Mild fatigue. Faint mild ha. Mild ear pressure.   LMP- 2 wks ago.    Review of Systems  Constitutional: Positive for fatigue. Negative for fever and chills.  HENT: Positive for sore throat. Negative for congestion, drooling, ear discharge, facial swelling, nosebleeds, postnasal drip, rhinorrhea, sinus pressure and tinnitus.        Mild ear pressure.  Respiratory: Negative for apnea, cough, choking and wheezing.   Gastrointestinal: Negative for abdominal pain.  Musculoskeletal: Positive for myalgias. Negative for back pain.  Neurological: Positive for headaches. Negative for dizziness, seizures, weakness and light-headedness.       Faint ha.  Hematological: Negative for adenopathy. Does not bruise/bleed easily.  Psychiatric/Behavioral: Negative for behavioral problems and confusion. The patient is not nervous/anxious.       Past Medical History  Diagnosis Date  . Depression   . History of kidney stones     History   Social History  . Marital Status: Married    Spouse Name: N/A  . Number of Children: N/A  . Years of Education: N/A   Occupational History  . Not on file.   Social History Main Topics  . Smoking status: Former Smoker    Types: Cigarettes    Quit date: 07/31/2013  . Smokeless tobacco: Never Used     Comment: Using vapor cigarettes.  . Alcohol Use: 4.0 oz/week    8 drink(s) per week  . Drug Use: Not on file  . Sexual Activity: Not on file   Other Topics Concern  . 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 Wisdom tooth extraction    . Cholecystectomy  10/17/12    lap chole    Family History  Problem Relation Age of Onset  . Arthritis Mother   . Cancer Father     colon  . Acute myelogenous leukemia Maternal Grandfather   . Cancer Paternal Grandfather     colon  . Diabetes Paternal Grandfather   . Hemophilia Mother     No Known Allergies  Current Outpatient Prescriptions on File Prior to Visit  Medication Sig Dispense Refill  . norgestimate-ethinyl estradiol (SPRINTEC 28) 0.25-35 MG-MCG tablet TAKE ONE TABLET BY MOUTH ONCE DAILY 28 tablet 11   No current facility-administered medications on file prior to visit.    BP 126/78 mmHg  Pulse 92  Temp(Src) 99.1 F (37.3 C) (Oral)  Ht 5' 5.5" (1.664 m)  Wt 151 lb 12.8 oz (68.856 kg)  BMI 24.87 kg/m2  SpO2 97%  LMP 01/29/2015      Objective:   Physical Exam  General- No acute distress, pleasant pt.  Neck- from, No nuccal rigidity, Mild submandibular node hypertrophy.  Lungs- Clear even and unlabored.  Heart- Regular, rate and rhythm. HEENT- Head- normocephalic Eyes- PEERL bilaterally. Ears- Canals clear, normal tm's bilaterally. Nose- No  frontal or maxillary sinus tenderness to palpation. Turbinates normal. Throat- posterior pharynx shows   No  tonsillar hypertrophy, bright   erythma,  No  discharge.   Neurologic- CN III- XII grossly intact.  Abdomen- soft, nt, nd, +bs, no rebound or guarding.No organomegaly.      Assessment & Plan:

## 2015-02-07 NOTE — Patient Instructions (Addendum)
Acute pharyngitis Your strep test was positive. I am prescribing azithromycin antibiotic. Rest hydrate, tylenol for fever and warm salt water gargles. Follow up in 7 days or as needed.     Only opened to make sure flu test was properly associated.

## 2015-02-07 NOTE — Assessment & Plan Note (Signed)
Your strep test was positive. I am prescribing azithromycin antibiotic. Rest hydrate, tylenol for fever and warm salt water gargles. Follow up in 7 days or as needed.

## 2015-02-07 NOTE — Progress Notes (Signed)
Pre visit review using our clinic review tool, if applicable. No additional management support is needed unless otherwise documented below in the visit note. 

## 2015-08-08 ENCOUNTER — Telehealth: Payer: Self-pay

## 2015-08-08 NOTE — Telephone Encounter (Signed)
Pt husband called stating best # to contact pt is [phone] (her cell #).

## 2015-08-08 NOTE — Telephone Encounter (Signed)
Pre Visit call completed with patient.  

## 2015-08-09 ENCOUNTER — Encounter: Payer: Self-pay | Admitting: Family

## 2015-08-09 ENCOUNTER — Ambulatory Visit (INDEPENDENT_AMBULATORY_CARE_PROVIDER_SITE_OTHER): Payer: BC Managed Care – PPO | Admitting: Family

## 2015-08-09 VITALS — BP 110/78 | HR 72 | Temp 98.8°F | Resp 16 | Ht 64.5 in | Wt 147.6 lb

## 2015-08-09 DIAGNOSIS — Z0000 Encounter for general adult medical examination without abnormal findings: Secondary | ICD-10-CM

## 2015-08-09 DIAGNOSIS — Z30011 Encounter for initial prescription of contraceptive pills: Secondary | ICD-10-CM

## 2015-08-09 LAB — BASIC METABOLIC PANEL
BUN: 8 mg/dL (ref 6–23)
CALCIUM: 10 mg/dL (ref 8.4–10.5)
CHLORIDE: 103 meq/L (ref 96–112)
CO2: 28 meq/L (ref 19–32)
Creatinine, Ser: 0.66 mg/dL (ref 0.40–1.20)
GFR: 112.13 mL/min (ref >60.00)
GLUCOSE: 91 mg/dL (ref 70–99)
Potassium: 4.2 mEq/L (ref 3.5–5.1)
SODIUM: 140 meq/L (ref 135–145)

## 2015-08-09 LAB — HEPATIC FUNCTION PANEL
ALBUMIN: 4.4 g/dL (ref 3.5–5.2)
ALT: 19 U/L (ref 0–35)
AST: 17 U/L (ref 0–37)
Alkaline Phosphatase: 66 U/L (ref 39–117)
Bilirubin, Direct: 0.1 mg/dL (ref 0.0–0.3)
Total Bilirubin: 0.6 mg/dL (ref 0.2–1.2)
Total Protein: 7.7 g/dL (ref 6.0–8.3)

## 2015-08-09 LAB — URINALYSIS, ROUTINE W REFLEX MICROSCOPIC
Bilirubin Urine: NEGATIVE
Ketones, ur: NEGATIVE
Nitrite: NEGATIVE
RBC / HPF: NONE SEEN (ref 0–?)
TOTAL PROTEIN, URINE-UPE24: NEGATIVE
UROBILINOGEN UA: 0.2 (ref 0.0–1.0)
Urine Glucose: NEGATIVE
pH: 6 (ref 5.0–8.0)

## 2015-08-09 LAB — CBC WITH DIFFERENTIAL/PLATELET
BASOS ABS: 0 10*3/uL (ref 0.0–0.1)
Basophils Relative: 0.5 % (ref 0.0–3.0)
EOS ABS: 0 10*3/uL (ref 0.0–0.7)
Eosinophils Relative: 0.3 % (ref 0.0–5.0)
HCT: 39.6 % (ref 36.0–46.0)
HEMOGLOBIN: 13.1 g/dL (ref 12.0–15.0)
LYMPHS PCT: 23 % (ref 12.0–46.0)
Lymphs Abs: 1.7 10*3/uL (ref 0.7–4.0)
MCHC: 33.2 g/dL (ref 30.0–36.0)
MCV: 90.5 fl (ref 78.0–100.0)
MONO ABS: 0.5 10*3/uL (ref 0.1–1.0)
Monocytes Relative: 6.6 % (ref 3.0–12.0)
Neutro Abs: 5.1 10*3/uL (ref 1.4–7.7)
Neutrophils Relative %: 69.6 % (ref 43.0–77.0)
Platelets: 384 10*3/uL (ref 150.0–400.0)
RBC: 4.37 Mil/uL (ref 3.87–5.11)
RDW: 13.1 % (ref 11.5–15.5)
WBC: 7.4 10*3/uL (ref 4.0–10.5)

## 2015-08-09 LAB — LIPID PANEL
CHOL/HDL RATIO: 2
Cholesterol: 195 mg/dL (ref 0–200)
HDL: 91 mg/dL (ref >39.00)
LDL CALC: 92 mg/dL (ref 0–99)
NonHDL: 103.86
TRIGLYCERIDES: 57 mg/dL (ref 0.0–149.0)
VLDL: 11.4 mg/dL (ref 0.0–40.0)

## 2015-08-09 LAB — HIV ANTIBODY (ROUTINE TESTING W REFLEX): HIV: NONREACTIVE

## 2015-08-09 LAB — TSH: TSH: 0.67 u[IU]/mL (ref 0.35–4.50)

## 2015-08-09 MED ORDER — NORGESTIMATE-ETH ESTRADIOL 0.25-35 MG-MCG PO TABS: ORAL_TABLET | ORAL | Status: DC

## 2015-08-09 NOTE — Progress Notes (Signed)
Subjective:    Patient ID: Tiffany Lyons, female    DOB: 25-Mar-1986, 29 y.o.   MRN: [id number]  HPI  Tiffany Lyons is a 29 yr old female who presents today for cpx.  Immunizations:last tetanus 08/2006.    Diet:reports healthy diet.  Wt Readings from Last 3 Encounters:  08/09/15 147 lb 9.6 oz (66.951 kg)  02/07/15 151 lb 12.8 oz (68.856 kg)  07/30/14 154 lb 3.2 oz (69.945 kg)  Exercise: reports that she walks/runs Pap Smear: 2014- normal Dental:  Up to date Eye exam-  due  Review of Systems  Constitutional: Negative for unexpected weight change.  HENT: Negative for hearing loss and rhinorrhea.   Eyes: Negative for visual disturbance.  Respiratory:       Mild cough left from uri last week- improving  Cardiovascular: Negative for leg swelling.  Gastrointestinal: Negative for nausea, vomiting and diarrhea.  Genitourinary: Negative for dysuria and frequency.  Musculoskeletal: Negative for myalgias and arthralgias.  Skin: Negative for rash.  Neurological: Negative for headaches.  Hematological: Negative for adenopathy.  Psychiatric/Behavioral:       Denies depression/anxiety   Past Medical History  Diagnosis Date  . Depression   . History of kidney stones     Social History   Social History  . Marital Status: Married    Spouse Name: N/A  . Number of Children: N/A  . Years of Education: N/A   Occupational History  . Not on file.   Social History Main Topics  . Smoking status: Former Smoker    Types: Cigarettes    Quit date: 07/31/2013  . Smokeless tobacco: Never Used     Comment: Using vapor cigarettes.  . Alcohol Use: 4.0 oz/week    8 drink(s) per week  . Drug Use: Not on file  . Sexual Activity: Not on file   Other Topics Concern  . 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 Wisdom tooth extraction    . Cholecystectomy  10/17/12    lap chole    Family History  Problem Relation Age of Onset  . Arthritis Mother   . Cancer Father     colon  . Acute myelogenous leukemia Maternal Grandfather   . Cancer Paternal Grandfather     colon  . Diabetes Paternal Grandfather   . Hemophilia Mother     No Known Allergies  Current Outpatient Prescriptions on File Prior to Visit  Medication Sig Dispense Refill  . ferrous sulfate 325 (65 FE) MG tablet Take 325 mg by mouth daily with breakfast.     No current facility-administered medications on file prior to visit.    BP 110/78 mmHg  Pulse 72  Temp(Src) 98.8 F (37.1 C) (Oral)  Resp 16  Ht 5' 4.5" (1.638 m)  Wt 147 lb 9.6 oz (66.951 kg)  BMI 24.95 kg/m2  LMP 07/31/2015       Objective:   Physical Exam  Physical Exam  Constitutional: She is oriented to person, place, and time. She appears well-developed and well-nourished. No distress.  HENT:  Head: Normocephalic and atraumatic.  Right Ear: Tympanic membrane and ear canal normal.  Left Ear: Tympanic membrane  and ear canal normal.  Mouth/Throat: Oropharynx is clear and moist.  Eyes: Pupils are equal, round, and reactive to light. No scleral icterus.  Neck: Normal range of motion. No thyromegaly present.  Cardiovascular: Normal rate and regular rhythm.   No murmur heard. Pulmonary/Chest: Effort normal and breath sounds normal. No respiratory distress. He has + wheezes. She has no rales. She exhibits no tenderness.  Abdominal: Soft. Bowel sounds are normal. He exhibits no distension and no mass. There is no tenderness. There is no rebound and no guarding.  Musculoskeletal: She exhibits no edema.  Lymphadenopathy:    She has no cervical adenopathy.  Neurological: She is alert and oriented to person, place, and time. She has normal patellar reflexes. She exhibits normal muscle tone. Coordination normal.    Skin: Skin is warm and dry. multiple tatoos Psychiatric: She has a normal mood and affect. Her behavior is normal. Judgment and thought content normal.  Breasts: Examined lying Right: Without masses, retractions, discharge or axillary adenopathy.  Left: Without masses, retractions, discharge or axillary adenopathy.  Inguinal/mons: Normal without inguinal adenopathy         Assessment & Plan:         Assessment & Plan:  Offered pt rx for albuterol inhaler prn- she declines, states she has one at home. Reports cough/wheezing is much better than last week.   Preventative care.- Quit smoking 1 month ago- commended pt. Continue healthy diet, exercise, flu shot up to date.  Obtain routine lab work.

## 2015-08-09 NOTE — Progress Notes (Signed)
Pre visit review using our clinic review tool, if applicable. No additional management support is needed unless otherwise documented below in the visit note. 

## 2015-08-09 NOTE — Patient Instructions (Signed)
Please complete lab work prior to leaving. Continue healthy diet and exercise.  

## 2015-08-10 LAB — PREGNANCY, URINE: PREG TEST UR: NEGATIVE

## 2015-08-11 ENCOUNTER — Encounter: Payer: Self-pay | Admitting: Family

## 2015-08-20 ENCOUNTER — Emergency Department (HOSPITAL_BASED_OUTPATIENT_CLINIC_OR_DEPARTMENT_OTHER)
Admission: EM | Admit: 2015-08-20 | Discharge: 2015-08-20 | Disposition: A | Discharge status: Home / Self Care | Payer: BC Managed Care – PPO | Attending: Emergency Medicine | Admitting: Emergency Medicine

## 2015-08-20 ENCOUNTER — Emergency Department (HOSPITAL_BASED_OUTPATIENT_CLINIC_OR_DEPARTMENT_OTHER): Payer: BC Managed Care – PPO

## 2015-08-20 ENCOUNTER — Encounter (HOSPITAL_BASED_OUTPATIENT_CLINIC_OR_DEPARTMENT_OTHER): Payer: Self-pay | Admitting: *Deleted

## 2015-08-20 DIAGNOSIS — Z3202 Encounter for pregnancy test, result negative: Secondary | ICD-10-CM | POA: Insufficient documentation

## 2015-08-20 DIAGNOSIS — Z8659 Personal history of other mental and behavioral disorders: Secondary | ICD-10-CM | POA: Insufficient documentation

## 2015-08-20 DIAGNOSIS — Y9289 Other specified places as the place of occurrence of the external cause: Secondary | ICD-10-CM | POA: Diagnosis not present

## 2015-08-20 DIAGNOSIS — R05 Cough: Secondary | ICD-10-CM | POA: Insufficient documentation

## 2015-08-20 DIAGNOSIS — S29011A Strain of muscle and tendon of front wall of thorax, initial encounter: Secondary | ICD-10-CM | POA: Insufficient documentation

## 2015-08-20 DIAGNOSIS — S299XXA Unspecified injury of thorax, initial encounter: Secondary | ICD-10-CM | POA: Diagnosis present

## 2015-08-20 DIAGNOSIS — Z793 Long term (current) use of hormonal contraceptives: Secondary | ICD-10-CM | POA: Insufficient documentation

## 2015-08-20 DIAGNOSIS — X58XXXA Exposure to other specified factors, initial encounter: Secondary | ICD-10-CM | POA: Insufficient documentation

## 2015-08-20 DIAGNOSIS — Y9389 Activity, other specified: Secondary | ICD-10-CM | POA: Diagnosis not present

## 2015-08-20 DIAGNOSIS — R0781 Pleurodynia: Secondary | ICD-10-CM

## 2015-08-20 DIAGNOSIS — Z87891 Personal history of nicotine dependence: Secondary | ICD-10-CM | POA: Insufficient documentation

## 2015-08-20 DIAGNOSIS — Z87442 Personal history of urinary calculi: Secondary | ICD-10-CM | POA: Insufficient documentation

## 2015-08-20 DIAGNOSIS — Y998 Other external cause status: Secondary | ICD-10-CM | POA: Insufficient documentation

## 2015-08-20 DIAGNOSIS — Z79899 Other long term (current) drug therapy: Secondary | ICD-10-CM | POA: Insufficient documentation

## 2015-08-20 LAB — PREGNANCY, URINE: PREG TEST UR: NEGATIVE

## 2015-08-20 MED ORDER — IBUPROFEN 600 MG PO TABS: 600.0000 mg | ORAL_TABLET | Freq: Four times a day (QID) | ORAL | Status: DC | PRN

## 2015-08-20 MED ORDER — DEXAMETHASONE 4 MG PO TABS
ORAL_TABLET | ORAL | Status: AC
  Filled 2015-08-20: qty 3

## 2015-08-20 MED ORDER — DEXAMETHASONE 4 MG PO TABS
10.0000 mg | ORAL_TABLET | Freq: Once | ORAL | Status: AC
  Administered 2015-08-20: 10 mg via ORAL

## 2015-08-20 MED ORDER — KETOROLAC TROMETHAMINE 30 MG/ML IJ SOLN
30.0000 mg | Freq: Once | INTRAMUSCULAR | Status: AC
  Administered 2015-08-20: 30 mg via INTRAMUSCULAR
  Filled 2015-08-20: qty 1

## 2015-08-20 NOTE — ED Notes (Signed)
Cough x 2 weeks and right rib pain.

## 2015-08-20 NOTE — ED Provider Notes (Signed)
CSN: [id number]     Arrival date & time 08/20/15  1703 History   First MD Initiated Contact with Patient 08/20/15 1747     Chief Complaint  Patient presents with  . Cough  . Rib pain      (Consider location/radiation/quality/duration/timing/severity/associated sxs/prior Treatment) HPI Comments: 29 y.o. Female with history of kidney stones, depression presents for right rib pain.  The patient reports that she has been sick for a couple of weeks with cough and cold.  These symptoms have actually been improving and she is feeling significantly better but has had continued cough with intermittent episodes of significant coughing.  She reports that during an episode shortly before presentation she felt like something in her right ribs popped and she has had pain in that area of her chest wall since that time.  Denies shortness of breath.  Deep breaths, coughing, movement make pain in that area worse.  Patient is a 29 y.o. female presenting with cough.  Cough Associated symptoms: chest pain (right chest wall)   Associated symptoms: no chills, no fever, no myalgias, no rash, no rhinorrhea and no shortness of breath     Past Medical History  Diagnosis Date  . Depression   . History of kidney stones    Past Surgical History  Procedure Laterality Date  . Wisdom tooth extraction    . Cholecystectomy  10/17/12    lap chole   Family History  Problem Relation Age of Onset  . Arthritis Mother   . Cancer Father     colon  . Acute myelogenous leukemia Maternal Grandfather   . Cancer Paternal Grandfather     colon  . Diabetes Paternal Grandfather   . Hemophilia Mother    Social History  Substance Use Topics  . Smoking status: Former Smoker    Types: Cigarettes    Quit date: 07/31/2013  . Smokeless tobacco: Never Used     Comment: Using vapor cigarettes.  . Alcohol Use: 4.0 oz/week    8 drink(s) per week   OB History    Gravida Para Term Preterm AB TAB SAB Ectopic Multiple Living   4  1 1  3            Obstetric Comments   2 abortions/1 miscarraige     Review of Systems  Constitutional: Negative for fever, chills, appetite change and fatigue.  HENT: Negative for congestion, postnasal drip, rhinorrhea and sinus pressure.   Eyes: Negative for visual disturbance.  Respiratory: Positive for cough. Negative for chest tightness and shortness of breath.   Cardiovascular: Positive for chest pain (right chest wall). Negative for palpitations.  Gastrointestinal: Negative for nausea, vomiting, abdominal pain and diarrhea.  Genitourinary: Negative for dysuria, urgency and frequency.  Musculoskeletal: Negative for myalgias and back pain.  Skin: Negative for rash.  Neurological: Negative for dizziness, weakness and numbness.  Hematological: Does not bruise/bleed easily.      Allergies  Review of patient's allergies indicates no known allergies.  Home Medications   Prior to Admission medications   Medication Sig Start Date End Date Taking? Authorizing Provider  ferrous sulfate 325 (65 FE) MG tablet Take 325 mg by mouth daily with breakfast.    Historical Provider, MD  ibuprofen (ADVIL,MOTRIN) 600 MG tablet Take 1 tablet (600 mg total) by mouth every 6 (six) hours as needed. 08/20/15   Harvel Quale, MD  norgestimate-ethinyl estradiol (Williams 28) 0.25-35 MG-MCG tablet TAKE ONE TABLET BY MOUTH ONCE DAILY 08/09/15   Debbrah Alar,  NP   BP 110/61 mmHg  Pulse 88  Temp(Src) 98.7 F (37.1 C) (Oral)  Resp 18  Ht 5\' 4"  (1.626 m)  Wt 147 lb (66.679 kg)  BMI 25.22 kg/m2  SpO2 100%  LMP 07/31/2015 Physical Exam  Constitutional: She is oriented to person, place, and time. She appears well-developed and well-nourished. No distress.  HENT:  Head: Normocephalic and atraumatic.  Right Ear: External ear normal.  Left Ear: External ear normal.  Nose: Nose normal.  Mouth/Throat: Oropharynx is clear and moist. No oropharyngeal exudate.  Eyes: EOM are normal. Pupils are  equal, round, and reactive to light.  Neck: Normal range of motion. Neck supple.  Cardiovascular: Normal rate, regular rhythm, normal heart sounds and intact distal pulses.   No murmur heard. Pulmonary/Chest: Effort normal. No respiratory distress. She has no wheezes. She has no rales. She exhibits tenderness.    Abdominal: Soft. She exhibits no distension. There is no tenderness.  Musculoskeletal: Normal range of motion. She exhibits no edema or tenderness.  Neurological: She is alert and oriented to person, place, and time.  Skin: Skin is warm and dry. No rash noted. She is not diaphoretic.  Vitals reviewed.   ED Course  Procedures (including critical care time) Labs Review Labs Reviewed  PREGNANCY, URINE    Imaging Review Dg Chest 2 View  08/20/2015  CLINICAL DATA:  Cough EXAM: CHEST  2 VIEW COMPARISON:  None. FINDINGS: Stable cardiomediastinal silhouette with normal heart size. No pneumothorax. No pleural effusion. Clear lungs, with no focal lung consolidation and no pulmonary edema. No displaced fracture. IMPRESSION: No active cardiopulmonary disease. Electronically Signed   By: Ilona Sorrel M.D.   On: 08/20/2015 17:59   I have personally reviewed and evaluated these images and lab results as part of my medical decision-making.   EKG Interpretation None      MDM  Patient seen and evaluated in stable condition.  Normal respiratory examination.  Reproducible right chest wall pain.  History and presentation consistent with chest wall strain secondary to coughing.  Chest xray unremarkable.  Patient given toradol and decadron with improvement in symptoms.  Incentive spirometer provided and patient educated and instructed on use.  Discussed clinical impression at length with patient who expressed understanding and agreement and patient was discharged home in stable condition with instructions to follow up outpatient.  All questions answered prior to discharge. Final diagnoses:  Rib  pain on right side    1. Right rib/chest wall strain    Harvel Quale, MD 08/21/15 [phone]

## 2015-08-20 NOTE — Discharge Instructions (Signed)
Rib Contusion  Your pain today appears to be secondary to injury to the ribs from coughing forcefully.  This can cause a strain in the muscles connecting to the ribs or bruising to the ribs.  Take Motrin as prescribed.  Rest and use the incentive spirometer.  Follow up with your primary care physician for reevaluation.  A rib contusion is a deep bruise on your rib area. Contusions are the result of a blunt trauma that causes bleeding and injury to the tissues under the skin. A rib contusion may involve bruising of the ribs and of the skin and muscles in the area. The skin overlying the contusion may turn blue, purple, or yellow. Minor injuries will give you a painless contusion, but more severe contusions may stay painful and swollen for a few weeks. CAUSES  A contusion is usually caused by a blow, trauma, or direct force to an area of the body. This often occurs while playing contact sports. SYMPTOMS  Swelling and redness of the injured area.  Discoloration of the injured area.  Tenderness and soreness of the injured area.  Pain with or without movement. DIAGNOSIS  The diagnosis can be made by taking a medical history and performing a physical exam. An X-ray, CT scan, or MRI may be needed to determine if there were any associated injuries, such as broken bones (fractures) or internal injuries. TREATMENT  Often, the best treatment for a rib contusion is rest. Icing or applying cold compresses to the injured area may help reduce swelling and inflammation. Deep breathing exercises may be recommended to reduce the risk of partial lung collapse and pneumonia. Over-the-counter or prescription medicines may also be recommended for pain control. HOME CARE INSTRUCTIONS   Apply ice to the injured area:  Put ice in a plastic bag.  Place a towel between your skin and the bag.  Leave the ice on for 20 minutes, 2-3 times per day.  Take medicines only as directed by your health care provider.  Rest  the injured area. Avoid strenuous activity and any activities or movements that cause pain. Be careful during activities and avoid bumping the injured area.  Perform deep-breathing exercises as directed by your health care provider.  Do not lift anything that is heavier than 5 lb (2.3 kg) until your health care provider approves.  Do not use any tobacco products, including cigarettes, chewing tobacco, or electronic cigarettes. If you need help quitting, ask your health care provider. SEEK MEDICAL CARE IF:   You have increased bruising or swelling.  You have pain that is not controlled with treatment.  You have a fever. SEEK IMMEDIATE MEDICAL CARE IF:   You have difficulty breathing or shortness of breath.  You develop a continual cough, or you cough up thick or bloody sputum.  You feel sick to your stomach (nauseous), you throw up (vomit), or you have abdominal pain.   This information is not intended to replace advice given to you by your health care provider. Make sure you discuss any questions you have with your health care provider.   Document Released: 06/23/2001 Document Revised: 10/19/2014 Document Reviewed: 07/10/2014 Elsevier Interactive Patient Education [employer].

## 2016-05-13 ENCOUNTER — Encounter: Payer: Self-pay | Admitting: Gastroenterology

## 2016-05-13 ENCOUNTER — Encounter: Payer: Self-pay | Admitting: Family

## 2016-05-13 ENCOUNTER — Ambulatory Visit (INDEPENDENT_AMBULATORY_CARE_PROVIDER_SITE_OTHER): Payer: BC Managed Care – PPO | Admitting: Family

## 2016-05-13 VITALS — BP 100/70 | HR 76 | Temp 98.2°F | Resp 16 | Ht 64.5 in | Wt 155.0 lb

## 2016-05-13 DIAGNOSIS — Z111 Encounter for screening for respiratory tuberculosis: Secondary | ICD-10-CM | POA: Diagnosis not present

## 2016-05-13 DIAGNOSIS — F32A Depression, unspecified: Secondary | ICD-10-CM

## 2016-05-13 DIAGNOSIS — Z23 Encounter for immunization: Secondary | ICD-10-CM

## 2016-05-13 DIAGNOSIS — E611 Iron deficiency: Secondary | ICD-10-CM

## 2016-05-13 DIAGNOSIS — F329 Major depressive disorder, single episode, unspecified: Secondary | ICD-10-CM

## 2016-05-13 DIAGNOSIS — Z800 Family history of malignant neoplasm of digestive organs: Secondary | ICD-10-CM | POA: Diagnosis not present

## 2016-05-13 MED ORDER — MULTI-VITAMIN/MINERALS PO TABS: 1.0000 | ORAL_TABLET | Freq: Every day | ORAL | Status: AC

## 2016-05-13 NOTE — Addendum Note (Signed)
Addended by: Kelle Darting A on: 05/13/2016 11:35 AM   Modules accepted: Orders

## 2016-05-13 NOTE — Progress Notes (Signed)
Subjective:    Patient ID: Tiffany Lyons, female    DOB: 12-25-1985, 30 y.o.   MRN: [id number]  HPI  Tiffany Lyons is a 30 yr old female who presents today for form completion for [employer].   Depression-reports good mood. No concerns at this time.     Iron Deficiency-has been taking multivitamin with minerals.   Lab Results  Component Value Date   WBC 7.4 08/09/2015   HGB 13.1 08/09/2015   HCT 39.6 08/09/2015   MCV 90.5 08/09/2015   PLT 384.0 08/09/2015   Family history of colon cancer- father was diagnosed at age 46, paternal GF, paternal great aunt.  Review of Systems See HPI  Past Medical History:  Diagnosis Date  . Depression   . History of kidney stones      Social History   Social History  . Marital status: Married    Spouse name: N/A  . Number of children: N/A  . Years of education: N/A   Occupational History  . Not on file.   Social History Main Topics  . Smoking status: Former Smoker    Types: Cigarettes    Quit date: 07/31/2013  . Smokeless tobacco: Never Used     Comment: Using vapor cigarettes.  . Alcohol use 4.0 oz/week    8 drink(s) per week  . Drug use: Unknown  . Sexual activity: Not on file   Other Topics Concern  . 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 CHOLECYSTECTOMY  10/17/12   lap chole  . WISDOM TOOTH EXTRACTION      Family History  Problem Relation Age of Onset  . Arthritis Mother   . Cancer Father     colon  . Acute myelogenous leukemia Maternal Grandfather   . Cancer Paternal Grandfather     colon  . Diabetes Paternal Grandfather   . Hemophilia Mother     No Known Allergies  Current Outpatient Prescriptions on File Prior to Visit  Medication Sig Dispense Refill  .  norgestimate-ethinyl estradiol (SPRINTEC 28) 0.25-35 MG-MCG tablet TAKE ONE TABLET BY MOUTH ONCE DAILY 28 tablet 11  . ferrous sulfate 325 (65 FE) MG tablet Take 325 mg by mouth daily with breakfast.     No current facility-administered medications on file prior to visit.     BP 100/70   Pulse 76   Temp 98.2 F (36.8 C) (Oral)   Resp 16   Ht 5' 4.5" (1.638 m)   Wt 155 lb (70.3 kg)   LMP 05/12/2016   SpO2 99% Comment: room air  BMI 26.19 kg/m       Objective:   Physical Exam  Constitutional: She is oriented to person, place, and time. She appears well-developed and well-nourished.  HENT:  Head: Normocephalic and atraumatic.  Cardiovascular: Normal rate, regular rhythm and normal heart sounds.   No murmur heard. Pulmonary/Chest: Effort normal and breath sounds normal. No respiratory distress. She has no wheezes.  Neurological: She is alert and oriented to person, place, and time.  Psychiatric: She has a normal mood and affect. Her behavior is normal. Judgment and thought content normal.  Assessment & Plan:  Family Hx colon cancer (first degree relative)- father at age 72. Will refer to GI for colonoscopy.  Tdap today and PPD placement.

## 2016-05-13 NOTE — Progress Notes (Signed)
Pre visit review using our clinic review tool, if applicable. No additional management support is needed unless otherwise documented below in the visit note. 

## 2016-05-13 NOTE — Patient Instructions (Signed)
You will be contacted about your referral to GI for colonoscopy.

## 2016-05-13 NOTE — Assessment & Plan Note (Signed)
On MVI with iron. Plan to repeat cbc at upcoming cpx this fall.

## 2016-05-13 NOTE — Assessment & Plan Note (Signed)
Stable off of meds.  Monitor.  

## 2016-05-15 LAB — TB SKIN TEST
INDURATION: 0 mm
TB SKIN TEST: NEGATIVE

## 2016-07-16 ENCOUNTER — Encounter: Payer: Self-pay | Admitting: Gastroenterology

## 2016-07-16 ENCOUNTER — Ambulatory Visit (INDEPENDENT_AMBULATORY_CARE_PROVIDER_SITE_OTHER): Payer: BC Managed Care – PPO | Admitting: Gastroenterology

## 2016-07-16 VITALS — BP 110/70 | HR 76 | Ht 64.0 in | Wt 156.0 lb

## 2016-07-16 DIAGNOSIS — Z800 Family history of malignant neoplasm of digestive organs: Secondary | ICD-10-CM | POA: Diagnosis not present

## 2016-07-16 MED ORDER — NA SULFATE-K SULFATE-MG SULF 17.5-3.13-1.6 GM/177ML PO SOLN: 1.0000 | Freq: Once | ORAL | 0 refills | Status: AC

## 2016-07-16 NOTE — Progress Notes (Signed)
[facility] Consult Note:  History: Tiffany Lyons 07/16/2016  Referring physician: Nance Pear., NP  Reason for consult/chief complaint: Colon Cancer Screening (family history of colon cancer-Father and fathers side)   Subjective  HPI:  Feels well - no GI symptoms. Father (dx age 30), Terrial Rhodes (Dx prob late 55's) and Pat GGF (unknown age Dx)with colon cancer. Father now with recurrence and probably terminal. No known uterine or ovarian CA  ROS:  Review of Systems Denies CR, DOE, urinary symptoms  Past Medical History: Past Medical History:  Diagnosis Date  . Depression   . Gallstones   . History of kidney stones      Past Surgical History: Past Surgical History:  Procedure Laterality Date  . CHOLECYSTECTOMY  10/17/12   lap chole  . WISDOM TOOTH EXTRACTION       Family History: Family History  Problem Relation Age of Onset  . Arthritis Mother   . Hemophilia Mother   . Irritable bowel syndrome Mother   . Colon cancer Father     first diagnosed at age 75, symptoms at 60  . Acute myelogenous leukemia Maternal Grandfather   . Diabetes Paternal Grandfather   . Colon cancer Paternal Grandfather   . Heart disease Paternal Grandfather   . Colon cancer Other     pat great gm, pat great great gf    Social History: Social History   Social History  . Marital status: Married    Spouse name: N/A  . Number of children: 1  . Years of education: N/A   Occupational History  . custodian, bus driver    Social History Main Topics  . Smoking status: Former Smoker    Types: Cigarettes    Quit date: 07/31/2013  . Smokeless tobacco: Never Used     Comment: Using vapor cigarettes.  . Alcohol use 4.0 oz/week    8 Standard drinks or equivalent per week     Comment: occ.  . Drug use: No  . Sexual activity: Not Asked   Other Topics Concern  . 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 Multiple Vitamins-Minerals (MULTIVITAMIN WITH MINERALS) tablet Take 1 tablet by mouth daily.    . norgestimate-ethinyl estradiol (SPRINTEC 28) 0.25-35 MG-MCG tablet TAKE ONE TABLET BY MOUTH ONCE DAILY 28 tablet 11   No current facility-administered medications for this visit.       ___________________________________________________________________ Objective   Exam:  BP 110/70 (BP Location: Left Arm, Patient Position: Sitting, Cuff Size: Normal)   Pulse 76   Ht 5\' 4"  (1.626 m)   Wt 156 lb (70.8 kg)   BMI 26.78 kg/m    General: this is a(n) well-appearing young woman   Eyes: sclera anicteric, no redness  ENT: oral mucosa moist without lesions, no cervical or supraclavicular lymphadenopathy, good dentition  CV: RRR without murmur, S1/S2, no JVD, no peripheral edema  Resp: clear to auscultation bilaterally, normal RR and effort noted  GI: soft, no tenderness, with active bowel sounds. No guarding or palpable organomegaly noted.    Assessment: Encounter Diagnosis  Name Primary?  Marland Kitchen  Family history of colon cancer Yes    Fam Hx worrisome for [facility].  Plan:  Colonoscopy Genetic counselor referral.  Thank you for the courtesy of this consult.  Please call me with any questions or concerns.  Nelida Meuse III  CC: Nance Pear., NP

## 2016-07-16 NOTE — Patient Instructions (Signed)
If you are age 30 or older, your body mass index should be between 23-30. Your Body mass index is 26.78 kg/m. If this is out of the aforementioned range listed, please consider follow up with your Primary Care Provider.  If you are age 21 or younger, your body mass index should be between 19-25. Your Body mass index is 26.78 kg/m. If this is out of the aformentioned range listed, please consider follow up with your Primary Care Provider.   We have sent the following medications to your pharmacy for you to pick up at your convenience: Suprep  Thank you for choosing [facility]  Dr Wilfrid Lund III

## 2016-07-29 ENCOUNTER — Encounter: Payer: Self-pay | Admitting: Genetic Counselor

## 2016-08-10 ENCOUNTER — Encounter: Payer: Self-pay | Admitting: Gastroenterology

## 2016-08-21 ENCOUNTER — Encounter: Payer: Self-pay | Admitting: Gastroenterology

## 2016-08-21 ENCOUNTER — Ambulatory Visit (AMBULATORY_SURGERY_CENTER): Payer: BC Managed Care – PPO | Admitting: Gastroenterology

## 2016-08-21 ENCOUNTER — Encounter: Payer: BC Managed Care – PPO | Admitting: Gastroenterology

## 2016-08-21 VITALS — BP 94/59 | HR 70 | Temp 97.8°F | Resp 12 | Ht 64.0 in | Wt 156.0 lb

## 2016-08-21 DIAGNOSIS — Z1211 Encounter for screening for malignant neoplasm of colon: Secondary | ICD-10-CM | POA: Diagnosis not present

## 2016-08-21 DIAGNOSIS — Z1212 Encounter for screening for malignant neoplasm of rectum: Secondary | ICD-10-CM | POA: Diagnosis not present

## 2016-08-21 DIAGNOSIS — Z800 Family history of malignant neoplasm of digestive organs: Secondary | ICD-10-CM

## 2016-08-21 MED ORDER — SODIUM CHLORIDE 0.9 % IV SOLN: 500.0000 mL | INTRAVENOUS | Status: DC

## 2016-08-21 NOTE — Op Note (Signed)
[name] Patient Name: Tiffany Lyons Procedure Date: 08/21/2016 9:59 AM MRN: [id number] Endoscopist: Mallie Mussel L. Loletha Carrow , MD Age: 30 Referring MD:  Date of Birth: Aug 07, 1986 Gender: Female Account #: [id number] Procedure:                Colonoscopy Indications:              Screening in patient at increased risk: Colorectal                            cancer in father before age 66 (also PGF and PGGF) Medicines:                Monitored Anesthesia Care Procedure:                Pre-Anesthesia Assessment:                           - Prior to the procedure, a History and Physical                            was performed, and patient medications and                            allergies were reviewed. The patient's tolerance of                            previous anesthesia was also reviewed. The risks                            and benefits of the procedure and the sedation                            options and risks were discussed with the patient.                            All questions were answered, and informed consent                            was obtained. Prior Anticoagulants: The patient has                            taken no previous anticoagulant or antiplatelet                            agents. ASA Grade Assessment: I - A normal, healthy                            patient. After reviewing the risks and benefits,                            the patient was deemed in satisfactory condition to                            undergo the procedure.  After obtaining informed consent, the colonoscope                            was passed under direct vision. Throughout the                            procedure, the patient's blood pressure, pulse, and                            oxygen saturations were monitored continuously. The                            Model PCF-H190DL [id number]) scope was introduced                            through the anus  and advanced to the the cecum,                            identified by appendiceal orifice and ileocecal                            valve. The ileocecal valve, appendiceal orifice,                            and rectum were photographed. The quality of the                            bowel preparation was excellent. The colonoscopy                            was performed without difficulty. The patient                            tolerated the procedure well. The bowel preparation                            used was SUPREP. Scope In: 10:10:29 AM Scope Out: 10:21:56 AM Scope Withdrawal Time: 0 hours 7 minutes 26 seconds  Total Procedure Duration: 0 hours 11 minutes 27 seconds  Findings:                 The entire examined colon appeared normal on direct                            and retroflexion views. Complications:            No immediate complications. Estimated blood loss:                            None. Estimated Blood Loss:     Estimated blood loss: none. Recommendation:           - Repeat colonoscopy in 5 years for screening                            purposes. Await results of patient's upcoming  genetic testing may shorten this interval.                           - Patient has a contact number available for                            emergencies. The signs and symptoms of potential                            delayed complications were discussed with the                            patient. Return to normal activities tomorrow.                            Written discharge instructions were provided to the                            patient.                           - Resume previous diet.                           - Continue present medications. [name] L. Loletha Carrow, MD 08/21/2016 10:29:02 AM This report has been signed electronically.

## 2016-08-21 NOTE — Progress Notes (Signed)
To recovery, report to Monday, RN, VSS 

## 2016-08-21 NOTE — Patient Instructions (Signed)
YOU HAD AN ENDOSCOPIC PROCEDURE TODAY AT THE [facility]:   Refer to the procedure report that was given to you for any specific questions about what was found during the examination.  If the procedure report does not answer your questions, please call your gastroenterologist to clarify.  If you requested that your care partner not be given the details of your procedure findings, then the procedure report has been included in a sealed envelope for you to review at your convenience later.  YOU SHOULD EXPECT: Some feelings of bloating in the abdomen. Passage of more gas than usual.  Walking can help get rid of the air that was put into your GI tract during the procedure and reduce the bloating. If you had a lower endoscopy (such as a colonoscopy or flexible sigmoidoscopy) you may notice spotting of blood in your stool or on the toilet paper. If you underwent a bowel prep for your procedure, you may not have a normal bowel movement for a few days.  Please Note:  You might notice some irritation and congestion in your nose or some drainage.  This is from the oxygen used during your procedure.  There is no need for concern and it should clear up in a day or so.  SYMPTOMS TO REPORT IMMEDIATELY:   Following lower endoscopy (colonoscopy or flexible sigmoidoscopy):  Excessive amounts of blood in the stool  Significant tenderness or worsening of abdominal pains  Swelling of the abdomen that is new, acute  Fever of 100F or higher   For urgent or emergent issues, a gastroenterologist can be reached at any hour by calling [phone].   DIET:  We do recommend a small meal at first, but then you may proceed to your regular diet.  Drink plenty of fluids but you should avoid alcoholic beverages for 24 hours.  ACTIVITY:  You should plan to take it easy for the rest of today and you should NOT DRIVE or use heavy machinery until tomorrow (because of the sedation medicines used during the test).     FOLLOW UP: Our staff will call the number listed on your records the next business day following your procedure to check on you and address any questions or concerns that you may have regarding the information given to you following your procedure. If we do not reach you, we will leave a message.  However, if you are feeling well and you are not experiencing any problems, there is no need to return our call.  We will assume that you have returned to your regular daily activities without incident.  If any biopsies were taken you will be contacted by phone or by letter within the next 1-3 weeks.  Please call us at [phone] if you have not heard about the biopsies in 3 weeks.    SIGNATURES/CONFIDENTIALITY: You and/or your care partner have signed paperwork which will be entered into your electronic medical record.  These signatures attest to the fact that that the information above on your After Visit Summary has been reviewed and is understood.  Full responsibility of the confidentiality of this discharge information lies with you and/or your care-partner.  Thank you for letting us take care of your healthcare needs today. 

## 2016-08-24 ENCOUNTER — Telehealth: Payer: Self-pay | Admitting: *Deleted

## 2016-08-24 NOTE — Telephone Encounter (Signed)
  Follow up Call-  Call back number 08/21/2016  Post procedure Call Back phone  # [phone]  Some recent data might be hidden     Patient questions:  Do you have a fever, pain , or abdominal swelling? No. Pain Score  0 *  Have you tolerated food without any problems? Yes.    Have you been able to return to your normal activities? Yes.    Do you have any questions about your discharge instructions: Diet   No. Medications  No. Follow up visit  No.  Do you have questions or concerns about your Care? No.  Actions: * If pain score is 4 or above: No action needed, pain <4.

## 2016-09-14 ENCOUNTER — Telehealth: Payer: Self-pay | Admitting: Genetic Counselor

## 2016-09-14 NOTE — Telephone Encounter (Signed)
Returned pt's call to reschedule appt. Couldn't lv vm bc I wasn't setup.

## 2016-09-15 ENCOUNTER — Telehealth: Payer: Self-pay | Admitting: Gastroenterology

## 2016-09-15 NOTE — Telephone Encounter (Signed)
Called over to the genetics counseling, spoke to the scheduler who will reach out to the patient to reschedule appointment.

## 2016-09-16 ENCOUNTER — Other Ambulatory Visit: Payer: BC Managed Care – PPO

## 2016-09-16 ENCOUNTER — Encounter: Payer: BC Managed Care – PPO | Admitting: Genetic Counselor

## 2016-10-26 ENCOUNTER — Telehealth: Payer: Self-pay | Admitting: Genetic Counselor

## 2016-10-26 NOTE — Telephone Encounter (Signed)
Lt vm regarding rescheduled 01/22 genetics appt.

## 2016-10-29 ENCOUNTER — Encounter: Payer: Self-pay | Admitting: Genetic Counselor

## 2016-11-02 ENCOUNTER — Encounter: Payer: BC Managed Care – PPO | Admitting: Genetic Counselor

## 2016-11-02 ENCOUNTER — Other Ambulatory Visit: Payer: BC Managed Care – PPO

## 2016-11-09 ENCOUNTER — Ambulatory Visit (HOSPITAL_BASED_OUTPATIENT_CLINIC_OR_DEPARTMENT_OTHER): Payer: BC Managed Care – PPO | Admitting: Genetic Counselor

## 2016-11-09 ENCOUNTER — Encounter: Payer: Self-pay | Admitting: Genetic Counselor

## 2016-11-09 ENCOUNTER — Other Ambulatory Visit: Payer: BC Managed Care – PPO

## 2016-11-09 DIAGNOSIS — Z315 Encounter for genetic counseling: Secondary | ICD-10-CM | POA: Diagnosis not present

## 2016-11-09 DIAGNOSIS — Z800 Family history of malignant neoplasm of digestive organs: Secondary | ICD-10-CM

## 2016-11-09 LAB — HM COLONOSCOPY

## 2016-11-09 NOTE — Progress Notes (Signed)
Tiffany Lyons   Patient Name: Tiffany Lyons Patient DOB: [birth year] Encounter Date: 11/09/2016  Referring Provider: Wilfrid Lund, MD  Primary Care Provider: Nance Pear., NP  Reason for Visit: Evaluate for hereditary susceptibility to cancer  Tiffany Lyons, a 31 y.o. female, is being seen at the [facility] due to a family history of cancer. She presents to [facility] today to discuss the possibility of a hereditary predisposition to cancer and discuss whether genetic testing is warranted.  History of Present Illness: Tiffany Lyons has no personal history of cancer. She had a colonoscopy on 08/2016 that was negative for polyps. She reports that she will have another screen in 5 years.  Past Medical History:  Diagnosis Date  . Depression   . Family history of colon cancer   . Gallstones   . History of kidney stones     Past Surgical History:  Procedure Laterality Date  . CHOLECYSTECTOMY  10/17/12   lap chole  . WISDOM TOOTH EXTRACTION      Social History   Social History  . Marital status: Married    Spouse name: N/A  . Number of children: 1  . Years of education: N/A   Occupational History  . custodian, bus driver    Social History Main Topics  . Smoking status: Former Smoker    Types: Cigarettes    Quit date: 07/31/2013  . Smokeless tobacco: Never Used     Comment: Using vapor cigarettes.  . Alcohol use 4.0 oz/week    8 Standard drinks or equivalent per week     Comment: occ.  . Drug use: No  . Sexual activity: Not on file   Other Topics Concern  . 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 Family tree will be scanned in the Media tab  in Epic  Significant diagnoses include the following:  Family History  Problem Relation Age of Onset  . Arthritis Mother   . Hemophilia Mother   . Irritable bowel syndrome Mother   . Colon cancer Father     first diagnosed at age 88, symptoms at 30  . Acute myelogenous leukemia Maternal Grandfather   . Diabetes Paternal Grandfather   . Colon cancer Paternal Grandfather     Dx 51s; currently 79  . Heart disease Paternal Grandfather   . Colon cancer Other     pat grandfather's mother  . Cancer Other     several siblings of paternal grandfather; unk. types of cancer    Additionally, [name] has one son (age 72). She has one full brother (age 30). She has a maternal half-sister (age 15) and a paternal half-sister (age 74). She is not in contact with her father or his family and explained he has declined genetic testing.  Tiffany Lyons ancestry is Caucasian - NOS. There is no known Jewish ancestry and no consanguinity.  Assessment and Plan: Tiffany Lyons is a 31 y.o. female with a family history of colon cancer as noted above. This history is suggestive of  a hereditary predisposition to cancer, specifically Lynch syndrome. Genetic testing is recommended to determine whether she has a pathogenic mutation that would impact her cancer screening and risk-reduction options. We reviewed the characteristics, features and inheritance patterns of hereditary cancer syndromes with a focus on hereditary colorectal cancers. We discussed the limitations of testing her instead of her father, but her father has declined testing. We discussed the process of genetic testing, including insurance coverage and implications of results: positive, negative and Variant of Uncertain Significance. A negative result will be somewhat reassuring, but she understood will not mean she is not at increased risk of colon cancer due to her family history.   Tiffany Lyons wished to pursue genetic testing and a blood sample will be sent for  analysis of the 43 genes on [facility] Common Cancers panel (APC, ATM, AXIN2, BARD1, BMPR1A, BRCA1, BRCA2, BRIP1, CDH1, CDKN2A, CHEK2, DICER1, EPCAM, GREM1, HOXB13, KIT, MEN1, MLH1, MSH2, MSH6, MUTYH, NBN, NF1, PALB2, PDGFRA, PMS2, POLD1, POLE, PTEN, RAD50, RAD51C, RAD51D, SDHA, SDHB, SDHC, SDHD, SMAD4, SMARCA4, STK11, TP53, TSC1, TSC2, VHL). Results should be available in approximately 2-4 weeks, at which point we will contact her and address implications for her as well as address genetic testing for at-risk family members, if needed.    Tiffany Lyons is encouraged to remain in contact with Cancer Genetics annually so that we can update the family history and inform her of any changes in cancer genetics and testing that may be of benefit for this family. Ms.  Lyons questions were answered to her satisfaction today and she is welcome to call with any additional questions or concerns. Thank you for the referral and allowing [place] to share in the care of your patient.   Dr. Jana Hakim was available for questions concerning this case. Total time spent by Steele Berg, MS, CGC in face-to-face counseling was approximately 35 minutes.   Steele Berg, MS, [place] Certified Genetic Counselor phone: [phone] [email or web address]   ______________________________________________________________________ For Office Staff:  Number of people involved in session: 1 Was an Intern/ student involved with case: no

## 2016-11-25 ENCOUNTER — Ambulatory Visit: Payer: Self-pay | Admitting: Genetic Counselor

## 2016-11-25 ENCOUNTER — Encounter: Payer: Self-pay | Admitting: Genetic Counselor

## 2016-11-25 DIAGNOSIS — Z1379 Encounter for other screening for genetic and chromosomal anomalies: Secondary | ICD-10-CM

## 2016-11-25 HISTORY — DX: Encounter for other screening for genetic and chromosomal anomalies: Z13.79

## 2016-11-25 NOTE — Progress Notes (Signed)
[facility]    Patient Name: Tiffany Lyons Patient DOB: 02-Feb-1986 Patient Age: 31 y.o. Encounter Date: 11/25/2016  Referring Provider: Wilfrid Lund, MD  Primary Care Provider: Nance Pear., NP  Tiffany Lyons was called today to discuss genetic test results. Please see the Genetics note from her visit on 11/09/16 for a detailed discussion of her personal and family history.  Genetic Testing: At the time of Tiffany Lyons visit, we recommended she pursue genetic testing of multiple associated with a hereditary predisposition to cancer. Testing included sequencing and deletion/duplication analysis. Testing was normal and did not reveal a mutation in these genes. A copy of the genetic test report will be scanned into Epic under the media tab.  The genes tested were the 43 genes on [facility]'s Common Cancers panel (APC, ATM, AXIN2, BARD1, BMPR1A, BRCA1, BRCA2, BRIP1, CDH1, CDKN2A, CHEK2, DICER1, EPCAM, GREM1, HOXB13, KIT, MEN1, MLH1, MSH2, MSH6, MUTYH, NBN, NF1, PALB2, PDGFRA, PMS2, POLD1, POLE, PTEN, RAD50, RAD51C, RAD51D, SDHA, SDHB, SDHC, SDHD, SMAD4, SMARCA4, STK11, TP53, TSC1, TSC2, VHL).  Since the current test is not perfect, it is possible there may be a gene mutation that current testing cannot detect, but that chance is small. We also discussed that it is possible that a different genetic factor, which was not part of this testing or has not yet been discovered, is responsible for the cancer diagnoses in the family. Again, the likelihood of this is low. Lastly, it may be that there is a detectable mutation in her family (such as in her father) that she did not inherit. No additional testing is recommended at this time for Tiffany Lyons. Her father is strongly recommended to undergo testing.   Cancer Screening: This normal result is generally reassuring, but Tiffany Lyons understands that her risk of colon cancer is increased due to her family history. She  is recommended to follow the recommendations of her GI as to how often to undergo colonoscopy.  We discussed undergoing breast screenings that are recommended by the [place] for women in the general population, but that may need to be modified based on other risk factors such as dense breasts, biopsy history or family history.  Breast awareness - Women should be familiar with their breasts and promptly report changes to their healthcare provider.   Between ages 58-39: Breast exam, risk assessment, and risk reduction counseling by the provider every 1-3 years.   Starting at age 63: Breast exam, risk assessment, and risk reduction counseling by the provider and mammogram every year. The provider may discuss screening with tomosynthesis.  Tiffany Lyons is also recommended to undergo a yearly gynecologic exam.  Family Members: Family members are at some increased risk of developing cancer, over the general population risk, simply due to the family history. Given the young age of colorectal cancer in Tiffany Lyons's father, her brother and paternal half-sister are also recommended to continue undergoing close surveillance. Of note, Tiffany Lyons normal results do not inform the genetic status of her siblings who may wish to undergo testing as well.  Any relative who had cancer at a young age or had a particularly rare cancer may also wish to pursue genetic testing. Genetic counselors can be located in other cities, by visiting the website of the [employer] ([employer]ist[employer].se) and Field seismologist for a Dietitian by zip code.    Lastly, cancer genetics is a rapidly advancing field and  it is possible that new genetic tests will be appropriate for her in the future. We encourage her to remain in contact with [place] on an annual basis so we can update her personal and family histories, and let her know of advances in cancer genetics that may benefit the family. Our contact  number was provided. Tiffany Lyons is welcome to call anytime with additional questions.    Tiffany Berg, MS, [place] Certified Genetic Counselor phone: [phone] [email or web address]

## 2016-11-26 ENCOUNTER — Telehealth: Payer: Self-pay | Admitting: Gastroenterology

## 2016-11-26 NOTE — Telephone Encounter (Signed)
Patient advised of results and recommendation for 5 year recall.

## 2016-11-26 NOTE — Telephone Encounter (Signed)
Please let patient know that I rec'd a copy of the genetic counselor note. No detectable mutations. So we will keep her on a 5 year recall for colonoscopy.  Please make sure that is what we have in the recall database.

## 2017-02-09 HISTORY — PX: COLONOSCOPY: SHX174

## 2017-02-25 ENCOUNTER — Ambulatory Visit (INDEPENDENT_AMBULATORY_CARE_PROVIDER_SITE_OTHER): Payer: BC Managed Care – PPO | Admitting: Family

## 2017-02-25 ENCOUNTER — Encounter: Payer: Self-pay | Admitting: Family

## 2017-02-25 VITALS — BP 119/70 | HR 71 | Temp 98.9°F | Resp 16 | Ht 64.5 in | Wt 158.8 lb

## 2017-02-25 DIAGNOSIS — H6982 Other specified disorders of Eustachian tube, left ear: Secondary | ICD-10-CM

## 2017-02-25 MED ORDER — FLUTICASONE PROPIONATE 50 MCG/ACT NA SUSP: 2.0000 | Freq: Every day | NASAL | 2 refills | Status: DC

## 2017-02-25 MED ORDER — LORATADINE 10 MG PO TABS: 10.0000 mg | ORAL_TABLET | Freq: Every day | ORAL | 11 refills | Status: DC

## 2017-02-25 NOTE — Progress Notes (Signed)
Subjective:    Patient ID: Tiffany Lyons, female    DOB: 1985/10/30, 31 y.o.   MRN: [id number]  HPI   Tiffany Lyons is a 31 yr old female who presents today with chief complaint of intermittent left ear pain since January.  Has had constant sharp pain since Monday. Denies fever. Notes that sound is sometimes muffled in the left ear and sometimes she has associated ringing in the left ear.     Review of Systems See HPI  Past Medical History:  Diagnosis Date  . Depression   . Family history of colon cancer   . Gallstones   . Genetic testing 11/25/2016   Test Results: No pathogenic mutations detected.  Genes Analyzed: 43 genes on [facility]'s Common Cancers panel (APC, ATM, AXIN2, BARD1, BMPR1A, BRCA1, BRCA2, BRIP1, CDH1, CDKN2A, CHEK2, DICER1, EPCAM, GREM1, HOXB13, KIT, MEN1, MLH1, MSH2, MSH6, MUTYH, NBN, NF1, PALB2, PDGFRA, PMS2, POLD1, POLE, PTEN, RAD50, RAD51C, RAD51D, SDHA, SDHB, SDHC, SDHD, SMAD4, SMARCA4, STK11, TP53, TSC1, TSC2, VHL).  . History of kidney stones      Social History   Social History  . Marital status: Married    Spouse name: N/A  . Number of children: 1  . Years of education: N/A   Occupational History  . custodian, bus driver    Social History Main Topics  . Smoking status: Former Smoker    Types: Cigarettes    Quit date: 07/31/2013  . Smokeless tobacco: Never Used     Comment: Using vapor cigarettes.  . Alcohol use 4.0 oz/week    8 Standard drinks or equivalent per week     Comment: occ.  . Drug use: No  . Sexual activity: Not on file   Other Topics Concern  . 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 CHOLECYSTECTOMY  10/17/12   lap chole  . WISDOM TOOTH EXTRACTION      Family History  Problem Relation  Age of Onset  . Arthritis Mother   . Hemophilia Mother   . Irritable bowel syndrome Mother   . Colon cancer Father        first diagnosed at age 21, symptoms at 73  . Acute myelogenous leukemia Maternal Grandfather   . Diabetes Paternal Grandfather   . Colon cancer Paternal Grandfather        Dx 2s; currently 67  . Heart disease Paternal Grandfather   . Colon cancer Other        pat grandfather's mother  . Cancer Other        several siblings of paternal grandfather; unk. types of cancer    No Known Allergies  Current Outpatient Prescriptions on File Prior to Visit  Medication Sig Dispense Refill  . Multiple Vitamins-Minerals (MULTIVITAMIN WITH MINERALS) tablet Take 1 tablet by mouth daily.     Current Facility-Administered Medications on File Prior to Visit  Medication Dose Route Frequency Provider Last Rate Last Dose  . 0.9 %  sodium chloride infusion  500 mL Intravenous Continuous Danis, Estill Cotta III, MD        BP 119/70 (BP Location: Right Arm,  Cuff Size: Normal)   Pulse 71   Temp 98.9 F (37.2 C) (Oral)   Resp 16   Ht 5' 4.5" (1.638 m)   Wt 158 lb 12.8 oz (72 kg)   LMP 02/25/2017   SpO2 100%   BMI 26.84 kg/m       Objective:   Physical Exam  Constitutional: She appears well-developed and well-nourished.  HENT:  Head: Normocephalic and atraumatic.  Right Ear: Tympanic membrane and ear canal normal.  Left Ear: Tympanic membrane and ear canal normal.  Mouth/Throat: Oropharynx is clear and moist. No oropharyngeal exudate, posterior oropharyngeal edema or posterior oropharyngeal erythema.  Psychiatric: She has a normal mood and affect. Her behavior is normal. Judgment and thought content normal.          Assessment & Plan:  Eustachian Tube Dysfuction- no sign of infection currently. Advised pt as follows:  Add claritin 46m once daily.  Add flonase 2 sprays each nostril once daily. Call if ear pain worsens or if not improved in 1 week.

## 2017-02-25 NOTE — Patient Instructions (Signed)
Add claritin 10mg  once daily.  Add flonase 2 sprays each nostril once daily. Call if ear pain worsens or if not improved in 1 week.

## 2017-04-30 ENCOUNTER — Ambulatory Visit (INDEPENDENT_AMBULATORY_CARE_PROVIDER_SITE_OTHER): Payer: BC Managed Care – PPO | Admitting: Family

## 2017-04-30 ENCOUNTER — Encounter: Payer: Self-pay | Admitting: Family

## 2017-04-30 ENCOUNTER — Other Ambulatory Visit (HOSPITAL_COMMUNITY)
Admission: RE | Admit: 2017-04-30 | Discharge: 2017-04-30 | Disposition: A | Discharge status: Home / Self Care | Payer: BC Managed Care – PPO | Source: Ambulatory Visit | Attending: Family | Admitting: Family

## 2017-04-30 VITALS — BP 111/68 | HR 74 | Temp 98.9°F | Resp 16 | Ht 65.0 in | Wt 158.8 lb

## 2017-04-30 DIAGNOSIS — Z01411 Encounter for gynecological examination (general) (routine) with abnormal findings: Secondary | ICD-10-CM | POA: Diagnosis not present

## 2017-04-30 DIAGNOSIS — N898 Other specified noninflammatory disorders of vagina: Secondary | ICD-10-CM | POA: Diagnosis not present

## 2017-04-30 DIAGNOSIS — B9689 Other specified bacterial agents as the cause of diseases classified elsewhere: Secondary | ICD-10-CM | POA: Diagnosis not present

## 2017-04-30 DIAGNOSIS — Z01419 Encounter for gynecological examination (general) (routine) without abnormal findings: Secondary | ICD-10-CM | POA: Diagnosis not present

## 2017-04-30 DIAGNOSIS — N920 Excessive and frequent menstruation with regular cycle: Secondary | ICD-10-CM | POA: Diagnosis not present

## 2017-04-30 DIAGNOSIS — Z0001 Encounter for general adult medical examination with abnormal findings: Secondary | ICD-10-CM

## 2017-04-30 DIAGNOSIS — Z0000 Encounter for general adult medical examination without abnormal findings: Secondary | ICD-10-CM

## 2017-04-30 DIAGNOSIS — N760 Acute vaginitis: Secondary | ICD-10-CM | POA: Diagnosis not present

## 2017-04-30 DIAGNOSIS — Z309 Encounter for contraceptive management, unspecified: Secondary | ICD-10-CM | POA: Diagnosis not present

## 2017-04-30 LAB — CBC WITH DIFFERENTIAL/PLATELET
BASOS ABS: 0 {cells}/uL (ref 0–200)
BASOS PCT: 0 %
EOS ABS: 85 {cells}/uL (ref 15–500)
EOS PCT: 1 %
HCT: 36.2 % (ref 35.0–45.0)
Hemoglobin: 11.9 g/dL (ref 11.7–15.5)
LYMPHS ABS: 1530 {cells}/uL (ref 850–3900)
Lymphocytes Relative: 18 %
MCH: 30.5 pg (ref 27.0–33.0)
MCHC: 32.9 g/dL (ref 32.0–36.0)
MCV: 92.8 fL (ref 80.0–100.0)
MONOS PCT: 8 %
MPV: 8.6 fL (ref 7.5–12.5)
Monocytes Absolute: 680 cells/uL (ref 200–950)
Neutro Abs: 6205 cells/uL (ref 1500–7800)
Neutrophils Relative %: 73 %
PLATELETS: 279 10*3/uL (ref 140–400)
RBC: 3.9 MIL/uL (ref 3.80–5.10)
RDW: 14 % (ref 11.0–15.0)
WBC: 8.5 10*3/uL (ref 3.8–10.8)

## 2017-04-30 LAB — BASIC METABOLIC PANEL
BUN: 7 mg/dL (ref 7–25)
CALCIUM: 9.1 mg/dL (ref 8.6–10.2)
CHLORIDE: 102 mmol/L (ref 98–110)
CO2: 24 mmol/L (ref 20–31)
Creat: 0.77 mg/dL (ref 0.50–1.10)
Glucose, Bld: 81 mg/dL (ref 65–99)
Potassium: 3.9 mmol/L (ref 3.5–5.3)
SODIUM: 139 mmol/L (ref 135–146)

## 2017-04-30 LAB — LIPID PANEL
CHOL/HDL RATIO: 1.9 ratio (ref <5.0)
Cholesterol: 170 mg/dL (ref <200)
HDL: 91 mg/dL (ref >50)
LDL CALC: 68 mg/dL (ref <100)
Triglycerides: 55 mg/dL (ref <150)
VLDL: 11 mg/dL (ref <30)

## 2017-04-30 LAB — HEPATIC FUNCTION PANEL
ALBUMIN: 4.1 g/dL (ref 3.6–5.1)
ALK PHOS: 78 U/L (ref 33–115)
ALT: 12 U/L (ref 6–29)
AST: 17 U/L (ref 10–30)
BILIRUBIN TOTAL: 0.7 mg/dL (ref 0.2–1.2)
Bilirubin, Direct: 0.2 mg/dL (ref <=0.2)
Indirect Bilirubin: 0.5 mg/dL (ref 0.2–1.2)
Total Protein: 6.8 g/dL (ref 6.1–8.1)

## 2017-04-30 LAB — POCT URINE HCG BY VISUAL COLOR COMPARISON TESTS: Preg Test, Ur: NEGATIVE

## 2017-04-30 MED ORDER — NORETHIN ACE-ETH ESTRAD-FE 1.5-30 MG-MCG PO TABS: 1.0000 | ORAL_TABLET | Freq: Every day | ORAL | 11 refills | Status: DC

## 2017-04-30 NOTE — Progress Notes (Signed)
Subjective:    Patient ID: Tiffany Lyons, female    DOB: [birth year], 31 y.o.   MRN: [id number]  HPI  Tiffany Lyons is a 31 yr old female who presents today for cpx.  Immunizations: tdap 05/13/16 Diet: healthy Exercise:  Started a exercise routine and running Pap Smear: 2014- due Vision: 2 years ago Dental: up to date  Wt Readings from Last 3 Encounters:  04/30/17 158 lb 12.8 oz (72 kg)  02/25/17 158 lb 12.8 oz (72 kg)  08/21/16 156 lb (70.8 kg)   Reports very heavy menses x 3 days.   Review of Systems  Constitutional: Negative for unexpected weight change.  HENT: Negative for hearing loss and rhinorrhea.   Eyes: Negative for visual disturbance.  Respiratory: Negative for cough.   Cardiovascular: Negative for leg swelling.  Gastrointestinal: Negative for constipation and diarrhea.  Genitourinary: Negative for dysuria and frequency.  Musculoskeletal: Negative for arthralgias and myalgias.  Skin: Negative for rash.  Neurological: Negative for headaches.  Hematological: Negative for adenopathy.  Psychiatric/Behavioral:       Denies depression/anxiety   Past Medical History:  Diagnosis Date  . Depression   . Family history of colon cancer   . Gallstones   . Genetic testing 11/25/2016   Test Results: No pathogenic mutations detected.  Genes Analyzed: 43 genes on [facility]'s Common Cancers panel (APC, ATM, AXIN2, BARD1, BMPR1A, BRCA1, BRCA2, BRIP1, CDH1, CDKN2A, CHEK2, DICER1, EPCAM, GREM1, HOXB13, KIT, MEN1, MLH1, MSH2, MSH6, MUTYH, NBN, NF1, PALB2, PDGFRA, PMS2, POLD1, POLE, PTEN, RAD50, RAD51C, RAD51D, SDHA, SDHB, SDHC, SDHD, SMAD4, SMARCA4, STK11, TP53, TSC1, TSC2, VHL).  . History of kidney stones      Social History   Social History  . Marital status: Married    Spouse name: N/A  . Number of children: 1  . Years of education: N/A   Occupational History  . custodian, bus driver    Social History Main Topics  . Smoking status: Former Smoker    Types: Cigarettes    Quit  date: 07/31/2013  . Smokeless tobacco: Never Used     Comment: Using vapor cigarettes.  . Alcohol use 4.0 oz/week    8 Standard drinks or equivalent per week     Comment: occ.  . Drug use: No  . Sexual activity: Not on file   Other Topics Concern  . 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 CHOLECYSTECTOMY  10/17/12   lap chole  . COLONOSCOPY  02/2017   will need repeat every 5 yrs per pt.  . WISDOM TOOTH EXTRACTION      Family History  Problem Relation Age of Onset  . Arthritis Mother   . Hemophilia Mother   . Irritable bowel syndrome Mother   . Colon cancer Father        first diagnosed at age 70, symptoms at 4  . Acute myelogenous leukemia Maternal Grandfather   . Diabetes Paternal Grandfather   . Colon cancer Paternal Grandfather        Dx 83s; currently 22  . Heart disease Paternal Grandfather   . Colon cancer Other  pat grandfather's mother  . Cancer Other        several siblings of paternal grandfather; unk. types of cancer    No Known Allergies  Current Outpatient Prescriptions on File Prior to Visit  Medication Sig Dispense Refill  . fluticasone (FLONASE) 50 MCG/ACT nasal spray Place 2 sprays into both nostrils daily. 16 g 2  . loratadine (CLARITIN) 10 MG tablet Take 1 tablet (10 mg total) by mouth daily. 30 tablet 11  . Multiple Vitamins-Minerals (MULTIVITAMIN WITH MINERALS) tablet Take 1 tablet by mouth daily.     Current Facility-Administered Medications on File Prior to Visit  Medication Dose Route Frequency Provider Last Rate Last Dose  . 0.9 %  sodium chloride infusion  500 mL Intravenous Continuous Danis, Estill Cotta III, MD        BP 111/68 (BP Location: Right Arm, Cuff Size: Normal)   Pulse 74   Temp  98.9 F (37.2 C) (Oral)   Resp 16   Ht _0  (1.651 m)   Wt 158 lb 12.8 oz (72 kg)   LMP 04/16/2017   SpO2 100%   BMI 26.43 kg/m       Objective:   Physical Exam Physical Exam  Constitutional: She is oriented to person, place, and time. She appears well-developed and well-nourished. No distress.  HENT:  Head: Normocephalic and atraumatic.  Right Ear: Tympanic membrane and ear canal normal.  Left Ear: Tympanic membrane and ear canal normal.  Mouth/Throat: Oropharynx is clear and moist.  Eyes: Pupils are equal, round, and reactive to light. No scleral icterus.  Neck: Normal range of motion. No thyromegaly present.  Cardiovascular: Normal rate and regular rhythm.   No murmur heard. Pulmonary/Chest: Effort normal and breath sounds normal. No respiratory distress. He has no wheezes. She has no rales. She exhibits no tenderness.  Abdominal: Soft. Bowel sounds are normal. She exhibits no distension and no mass. There is no tenderness. There is no rebound and no guarding.  Musculoskeletal: She exhibits no edema.  Lymphadenopathy:    She has no cervical adenopathy.  Neurological: She is alert and oriented to person, place, and time. She has normal patellar reflexes. She exhibits normal muscle tone. Coordination normal.  Skin: Skin is warm and dry.  Psychiatric: She has a normal mood and affect. Her behavior is normal. Judgment and thought content normal.  Breasts: Examined lying Right: Without masses, retractions, discharge or axillary adenopathy.  Left: Without masses, retractions, discharge or axillary adenopathy.  Inguinal/mons: Normal without inguinal adenopathy  External genitalia: Normal  BUS/Urethra/Skene's glands: Normal  Bladder: Normal  Vagina: Normal (some thin white vaginal discharge is noted) Cervix: Normal  Uterus: normal in size, shape and contour. Midline and mobile  Adnexa/parametria:  Rt: Without masses or tenderness.  Lt: Without masses or tenderness.  Anus and  perineum: Normal            Assessment & Plan:    Preventative care- pap performed. Will send for cotesting.  Obtain routine lab work. Continue healthy diet and regular exercise.   Menorrhagia- check CBC and will also add OCP. Check HCG today prior to starting.   Vaginal discharge- Will check for yeast/gardernella due to finding of vaginal discharge.     Assessment & Plan:

## 2017-04-30 NOTE — Addendum Note (Signed)
Addended by: Peggyann Shoals on: 04/30/2017 03:32 PM   Modules accepted: Orders

## 2017-04-30 NOTE — Patient Instructions (Signed)
Please complete lab work prior to leaving. Keep up the good work with healthy diet and exercise.  

## 2017-04-30 NOTE — Addendum Note (Signed)
Addended by: Kelle Darting A on: 04/30/2017 04:15 PM   Modules accepted: Orders

## 2017-05-01 LAB — URINALYSIS
Bilirubin Urine: NEGATIVE
GLUCOSE, UA: NEGATIVE
HGB URINE DIPSTICK: NEGATIVE
KETONES UR: NEGATIVE
Leukocytes, UA: NEGATIVE
Nitrite: NEGATIVE
PROTEIN: NEGATIVE
Specific Gravity, Urine: 1.015 (ref 1.001–1.035)
pH: 6 (ref 5.0–8.0)

## 2017-05-01 LAB — TSH: TSH: 0.77 m[IU]/L

## 2017-05-05 ENCOUNTER — Other Ambulatory Visit: Payer: Self-pay | Admitting: Family

## 2017-05-05 LAB — CYTOLOGY - PAP
Bacterial vaginitis: POSITIVE — AB
CANDIDA VAGINITIS: NEGATIVE
Diagnosis: NEGATIVE
HPV: NOT DETECTED

## 2017-05-05 MED ORDER — METRONIDAZOLE 0.75 % VA GEL: 1.0000 | Freq: Every day | VAGINAL | 0 refills | Status: AC

## 2017-05-05 NOTE — Progress Notes (Signed)
See rx. 

## 2017-06-04 ENCOUNTER — Encounter (HOSPITAL_COMMUNITY): Payer: Self-pay

## 2017-06-21 ENCOUNTER — Encounter: Payer: Self-pay | Admitting: *Deleted

## 2017-06-21 ENCOUNTER — Encounter: Payer: Self-pay | Admitting: Family

## 2017-06-21 ENCOUNTER — Ambulatory Visit (INDEPENDENT_AMBULATORY_CARE_PROVIDER_SITE_OTHER): Payer: BC Managed Care – PPO | Admitting: Family

## 2017-06-21 VITALS — BP 114/82 | HR 72 | Temp 98.7°F | Resp 16 | Ht 65.0 in | Wt 162.0 lb

## 2017-06-21 DIAGNOSIS — D229 Melanocytic nevi, unspecified: Secondary | ICD-10-CM

## 2017-06-21 NOTE — Progress Notes (Signed)
Subjective:    Patient ID: Tiffany Lyons, female    DOB: Aug 29, 1986, 32 y.o.   MRN: [id number]  HPI  Tiffany Lyons is a 31 yr old female who presents today with chief complaint of "mole" on her back.  Reports that mole is pruritic.    Review of Systems    see HPI  Past Medical History:  Diagnosis Date  . Depression   . Family history of colon cancer   . Gallstones   . Genetic testing 11/25/2016   Test Results: No pathogenic mutations detected.  Genes Analyzed: 43 genes on [facility]'s Common Cancers panel (APC, ATM, AXIN2, BARD1, BMPR1A, BRCA1, BRCA2, BRIP1, CDH1, CDKN2A, CHEK2, DICER1, EPCAM, GREM1, HOXB13, KIT, MEN1, MLH1, MSH2, MSH6, MUTYH, NBN, NF1, PALB2, PDGFRA, PMS2, POLD1, POLE, PTEN, RAD50, RAD51C, RAD51D, SDHA, SDHB, SDHC, SDHD, SMAD4, SMARCA4, STK11, TP53, TSC1, TSC2, VHL).  . History of kidney stones      Social History   Social History  . Marital status: Married    Spouse name: N/A  . Number of children: 1  . Years of education: N/A   Occupational History  . custodian, bus driver    Social History Main Topics  . Smoking status: Former Smoker    Types: Cigarettes    Quit date: 07/31/2013  . Smokeless tobacco: Never Used     Comment: Using vapor cigarettes.  . Alcohol use 4.0 oz/week    8 Standard drinks or equivalent per week     Comment: occ.  . Drug use: No  . Sexual activity: Not on file   Other Topics Concern  . 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 CHOLECYSTECTOMY  10/17/12   lap chole  . COLONOSCOPY  02/2017   will need repeat every 5 yrs per pt.  . WISDOM TOOTH EXTRACTION      Family History  Problem Relation Age of Onset  . Arthritis Mother   . Hemophilia Mother   . Irritable bowel syndrome Mother     . Colon cancer Father        first diagnosed at age 65, symptoms at 66  . Acute myelogenous leukemia Maternal Grandfather   . Diabetes Paternal Grandfather   . Colon cancer Paternal Grandfather        Dx 75s; currently 68  . Heart disease Paternal Grandfather   . Colon cancer Other        pat grandfather's mother  . Cancer Other        several siblings of paternal grandfather; unk. types of cancer    No Known Allergies  Current Outpatient Prescriptions on File Prior to Visit  Medication Sig Dispense Refill  . fluticasone (FLONASE) 50 MCG/ACT nasal spray Place 2 sprays into both nostrils daily. 16 g 2  . loratadine (CLARITIN) 10 MG tablet Take 1 tablet (10 mg total) by mouth daily. 30 tablet 11  . Multiple Vitamins-Minerals (MULTIVITAMIN WITH MINERALS) tablet Take 1 tablet by mouth daily.    . norethindrone-ethinyl estradiol-iron (MICROGESTIN FE 1.5/30) 1.5-30 MG-MCG tablet Take 1 tablet by mouth daily. 1 Package 11   Current Facility-Administered  Medications on File Prior to Visit  Medication Dose Route Frequency Provider Last Rate Last Dose  . 0.9 %  sodium chloride infusion  500 mL Intravenous Continuous Danis, Estill Cotta III, MD        BP 114/82 (BP Location: Right Arm, Cuff Size: Normal)   Pulse 72   Temp 98.7 F (37.1 C) (Oral)   Resp 16   Ht '5\' 5"'  (1.651 m)   Wt 162 lb (73.5 kg)   LMP 06/18/2017   SpO2 100%   BMI 26.96 kg/m    Objective:   Physical Exam  Constitutional: She is oriented to person, place, and time. She appears well-developed and well-nourished. No distress.  Neurological: She is alert and oriented to person, place, and time.  Skin: Skin is warm and dry.  57m wide nevus left lower back with irregular pigmentation noted  Psychiatric: She has a normal mood and affect. Her behavior is normal. Judgment and thought content normal.            Assessment & Plan:  Suspicious nevus- Procedure including risks/benefits explained to patient.  Questions  were answered. After informed consent was obtained and a time out completed, site was cleansed with betadine and then alcohol. 1% Lidocaine with epinephrine was injected under lesion and then shave biopsy was performed. Area was cauterized to obtain hemostasis.  Pt tolerated procedure well.  Specimen sent for pathology review.  Pt instructed to keep the area dry for 24 hours and to contact [place] she develops redness, drainage or swelling at the site.  Pt may use tylenol as needed for discomfort today.

## 2017-06-21 NOTE — Patient Instructions (Addendum)
Please keep the area clean and dry. Do not shower for 24 hours. Call if you develop surrounding redness/swelling/drainage.  We will contact you with the results of your biopsy results. Contact us if you have not heard back in 1 week about these results. You may use tylenol today as needed for pain.

## 2017-06-23 ENCOUNTER — Encounter: Payer: Self-pay | Admitting: Family

## 2017-06-28 ENCOUNTER — Telehealth: Payer: Self-pay | Admitting: *Deleted

## 2017-06-28 NOTE — Telephone Encounter (Signed)
Received results from Dermatopathology; forwarded to provider/SLS 09/17

## 2017-11-03 ENCOUNTER — Encounter: Payer: Self-pay | Admitting: Medical

## 2017-11-03 ENCOUNTER — Ambulatory Visit: Payer: BC Managed Care – PPO | Admitting: Medical

## 2017-11-03 VITALS — BP 117/75 | HR 86 | Temp 98.1°F | Resp 16 | Ht 65.0 in | Wt 170.0 lb

## 2017-11-03 DIAGNOSIS — R0981 Nasal congestion: Secondary | ICD-10-CM | POA: Diagnosis not present

## 2017-11-03 DIAGNOSIS — R05 Cough: Secondary | ICD-10-CM

## 2017-11-03 DIAGNOSIS — J111 Influenza due to unidentified influenza virus with other respiratory manifestations: Secondary | ICD-10-CM | POA: Diagnosis not present

## 2017-11-03 DIAGNOSIS — M791 Myalgia, unspecified site: Secondary | ICD-10-CM | POA: Diagnosis not present

## 2017-11-03 DIAGNOSIS — R059 Cough, unspecified: Secondary | ICD-10-CM

## 2017-11-03 LAB — POCT INFLUENZA A/B
INFLUENZA A, POC: NEGATIVE
Influenza B, POC: NEGATIVE

## 2017-11-03 MED ORDER — FLUTICASONE PROPIONATE 50 MCG/ACT NA SUSP: 2.0000 | Freq: Every day | NASAL | 1 refills | Status: DC

## 2017-11-03 MED ORDER — BENZONATATE 100 MG PO CAPS: 100.0000 mg | ORAL_CAPSULE | Freq: Three times a day (TID) | ORAL | 0 refills | Status: DC | PRN

## 2017-11-03 MED ORDER — OSELTAMIVIR PHOSPHATE 75 MG PO CAPS: 75.0000 mg | ORAL_CAPSULE | Freq: Two times a day (BID) | ORAL | 0 refills | Status: DC

## 2017-11-03 NOTE — Progress Notes (Signed)
Subjective:    Patient ID: Tiffany Lyons, female    DOB: 17-Apr-1986, 32 y.o.   MRN: [id number]  HPI  Pt in with some fever on Sunday. Cough, congestion , runny nose, fatigue and body aches. Pt states body aches severe on Monday. Pt is school bus driver.   Pt did get flu vaccine this year.   No sinus pressure. No productive cough. No wheezing.  Lids both eyes mild upper lid swelling. No matting. No itching. No dc. Today lids look better than yesterday per patient  LMP- this Monday.  Review of Systems  Constitutional: Positive for fatigue. Negative for chills and fever.  HENT: Positive for congestion. Negative for sinus pressure, sinus pain, sneezing, sore throat and voice change.   Respiratory: Positive for cough. Negative for chest tightness, shortness of breath and wheezing.   Cardiovascular: Negative for chest pain and palpitations.  Gastrointestinal: Negative for abdominal distention, abdominal pain, blood in stool, constipation and vomiting.  Musculoskeletal: Positive for myalgias.  Skin: Negative for rash.  Neurological: Negative for dizziness, speech difficulty, weakness, light-headedness and numbness.  Hematological: Negative for adenopathy. Does not bruise/bleed easily.  Psychiatric/Behavioral: Negative for behavioral problems and confusion. The patient is not nervous/anxious.     Past Medical History:  Diagnosis Date  . Depression   . Family history of colon cancer   . Gallstones   . Genetic testing 11/25/2016   Test Results: No pathogenic mutations detected.  Genes Analyzed: 43 genes on [facility] Common Cancers panel (APC, ATM, AXIN2, BARD1, BMPR1A, BRCA1, BRCA2, BRIP1, CDH1, CDKN2A, CHEK2, DICER1, EPCAM, GREM1, HOXB13, KIT, MEN1, MLH1, MSH2, MSH6, MUTYH, NBN, NF1, PALB2, PDGFRA, PMS2, POLD1, POLE, PTEN, RAD50, RAD51C, RAD51D, SDHA, SDHB, SDHC, SDHD, SMAD4, SMARCA4, STK11, TP53, TSC1, TSC2, VHL).  . History of kidney stones      Social History   Socioeconomic  History  . Marital status: Married    Spouse name: Not on file  . Number of children: 1  . Years of education: Not on file  . Highest education level: Not on file  Social Needs  . Financial resource strain: Not on file  . Food insecurity - worry: Not on file  . Food insecurity - inability: Not on file  . Transportation needs - medical: Not on file  . Transportation needs - non-medical: Not on file  Occupational History  . Occupation: custodian, bus driver  Tobacco Use  . Smoking status: Former Smoker    Types: Cigarettes    Last attempt to quit: 07/31/2013    Years since quitting: 4.2  . Smokeless tobacco: Never Used  . Tobacco comment: Using vapor cigarettes.  Substance and Sexual Activity  . Alcohol use: Yes    Alcohol/week: 4.0 oz    Types: 8 Standard drinks or equivalent per week    Comment: occ.  . Drug use: No  . Sexual activity: Not on file  Other Topics Concern  . 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 CHOLECYSTECTOMY  10/17/12   lap chole  . COLONOSCOPY  02/2017   will need repeat every 5 yrs per pt.  . WISDOM TOOTH EXTRACTION  Family History  Problem Relation Age of Onset  . Arthritis Mother   . Hemophilia Mother   . Irritable bowel syndrome Mother   . Colon cancer Father        first diagnosed at age 2, symptoms at 12  . Acute myelogenous leukemia Maternal Grandfather   . Diabetes Paternal Grandfather   . Colon cancer Paternal Grandfather        Dx 1s; currently 14  . Heart disease Paternal Grandfather   . Colon cancer Other        pat grandfather's mother  . Cancer Other        several siblings of paternal grandfather; unk. types of cancer    No Known Allergies  Current Outpatient Medications on File  Prior to Visit  Medication Sig Dispense Refill  . fluticasone (FLONASE) 50 MCG/ACT nasal spray Place 2 sprays into both nostrils daily. 16 g 2  . loratadine (CLARITIN) 10 MG tablet Take 1 tablet (10 mg total) by mouth daily. 30 tablet 11  . Multiple Vitamins-Minerals (MULTIVITAMIN WITH MINERALS) tablet Take 1 tablet by mouth daily.    . norethindrone-ethinyl estradiol-iron (MICROGESTIN FE 1.5/30) 1.5-30 MG-MCG tablet Take 1 tablet by mouth daily. 1 Package 11   Current Facility-Administered Medications on File Prior to Visit  Medication Dose Route Frequency Provider Last Rate Last Dose  . 0.9 %  sodium chloride infusion  500 mL Intravenous Continuous Danis, Estill Cotta III, MD        BP 117/75 (BP Location: Left Arm, Cuff Size: Normal)   Pulse 86   Temp 98.1 F (36.7 C) (Oral)   Resp 16   Ht '5\' 5"'  (1.651 m)   Wt 170 lb (77.1 kg)   LMP 11/01/2017   SpO2 98%   BMI 28.29 kg/m       Objective:   Physical Exam   General  Mental Status - Alert. General Appearance - Well groomed. Not in acute distress.  Skin Rashes- No Rashes.  HEENT Head- Normal. Ear Auditory Canal - Left- Normal. Right - Normal.Tympanic Membrane- Left- Normal. Right- Normal. Eye Sclera/Conjunctiva- Left- Normal. Right- Normal. Minimal lid swelling upper lid. Nose & Sinuses Nasal Mucosa- Left-  Boggy and Congested. Right-  Boggy and  Congested.Bilateral  No maxillary and no  frontal sinus pressure. Mouth & Throat Lips: Upper Lip- Normal: no dryness, cracking, pallor, cyanosis, or vesicular eruption. Lower Lip-Normal: no dryness, cracking, pallor, cyanosis or vesicular eruption. Buccal Mucosa- Bilateral- No Aphthous ulcers. Oropharynx- No Discharge or Erythema. Tonsils: Characteristics- Bilateral- No Erythema or Congestion. Size/Enlargement- Bilateral- No enlargement. Discharge- bilateral-None.  Neck Neck- Supple. No Masses.   Chest and Lung Exam Auscultation: Breath Sounds:-Clear even and  unlabored.  Cardiovascular Auscultation:Rythm- Regular, rate and rhythm. Murmurs & Other Heart Sounds:Ausculatation of the heart reveal- No Murmurs.  Lymphatic Head & Neck General Head & Neck Lymphatics: Bilateral: Description- No Localized lymphadenopathy.        Assessment & Plan:  You do have flu syndrome.  You are in the treatment timeframe for the flu(within first 48 hours of onset severe myalgias).  Please start Tamiflu today. Note you have had the flu vaccine but can still get the flu.  Also the flu test can give false negative results.  I decided to treat you clinically based on signs/symptoms.  My medical assistant will let you know the result.  Advised starting Tamiflu even if test is negative.  For your cough, I am prescribing benzonatate.  For nasal congestion, I am  prescribing Flonase.  Rest and keep well-hydrated.  For body aches can use ibuprofen.  If you get worse signs or symptoms such as sinus pressure, ear pain, or bronchitis type symptoms please let us know and I would give you antibiotic.  Work note to return to work on this coming Monday.  Follow-up in 7 days or as needed.  Rapid flu test did come back negative  Note regarding patient's upper eyelid swelling.  This is very minimal.  The eyes look clear and there is no discharge.  I did discuss with her that I think this will likely resolve.  Do not think she needs treatment presently.  But if she gets worse asked her to notify me.  If she gets any colored discharge or conjunctivitis type symptoms then I would call in her Tobrex eyedrops.  Patient expressed understanding. Rowan Blaker, Percell Miller, PA-C

## 2017-11-03 NOTE — Patient Instructions (Signed)
You do have flu syndrome.  You are in the treatment timeframe for the flu.  Please start Tamiflu today. Note you have had the flu vaccine but can still get the flu.  Also the flu test can give false negative results.  I decided to treat you clinically based on signs/symptoms.  My medical assistant will let you know the result.  Advised starting Tamiflu even if test is negative.  For your cough, I am prescribing benzonatate.  For nasal congestion, I am prescribing Flonase.  Rest and keep well-hydrated.  For body aches can use ibuprofen.  If you get worse signs or symptoms such as sinus pressure, ear pain, or bronchitis type symptoms please let us know and I would give you antibiotic.  Work note to return to work on this coming Monday.  Follow-up in 7 days or as needed.

## 2017-12-01 ENCOUNTER — Ambulatory Visit: Payer: BC Managed Care – PPO | Admitting: Family

## 2017-12-01 ENCOUNTER — Encounter: Payer: Self-pay | Admitting: Family

## 2017-12-01 ENCOUNTER — Other Ambulatory Visit (HOSPITAL_COMMUNITY)
Admission: RE | Admit: 2017-12-01 | Discharge: 2017-12-01 | Disposition: A | Discharge status: Home / Self Care | Payer: BC Managed Care – PPO | Source: Ambulatory Visit | Attending: Family | Admitting: Family

## 2017-12-01 VITALS — BP 116/67 | HR 83 | Temp 98.6°F | Resp 16 | Ht 65.0 in | Wt 168.0 lb

## 2017-12-01 DIAGNOSIS — N898 Other specified noninflammatory disorders of vagina: Secondary | ICD-10-CM

## 2017-12-01 DIAGNOSIS — N760 Acute vaginitis: Secondary | ICD-10-CM

## 2017-12-01 NOTE — Progress Notes (Signed)
va

## 2017-12-01 NOTE — Patient Instructions (Signed)
We will contact you with your results and further recommendations.  °

## 2017-12-01 NOTE — Progress Notes (Signed)
Subjective:    Patient ID: Tiffany Lyons, female    DOB: 03/05/1986, 32 y.o.   MRN: [id number]  HPI  Patient is a 32 year old female who presents today with chief complaint of vaginal odor.  She notes that odor first began about 2-1/2 weeks ago.  She has had some discharge which she describes as yellow in color.  No significant irritation.  Denies pelvic pain or fever.    Review of Systems See HPI  Past Medical History:  Diagnosis Date  . Depression   . Family history of colon cancer   . Gallstones   . Genetic testing 11/25/2016   Test Results: No pathogenic mutations detected.  Genes Analyzed: 43 genes on [facility]'s Common Cancers panel (APC, ATM, AXIN2, BARD1, BMPR1A, BRCA1, BRCA2, BRIP1, CDH1, CDKN2A, CHEK2, DICER1, EPCAM, GREM1, HOXB13, KIT, MEN1, MLH1, MSH2, MSH6, MUTYH, NBN, NF1, PALB2, PDGFRA, PMS2, POLD1, POLE, PTEN, RAD50, RAD51C, RAD51D, SDHA, SDHB, SDHC, SDHD, SMAD4, SMARCA4, STK11, TP53, TSC1, TSC2, VHL).  . History of kidney stones      Social History   Socioeconomic History  . Marital status: Married    Spouse name: Not on file  . Number of children: 1  . Years of education: Not on file  . Highest education level: Not on file  Social Needs  . Financial resource strain: Not on file  . Food insecurity - worry: Not on file  . Food insecurity - inability: Not on file  . Transportation needs - medical: Not on file  . Transportation needs - non-medical: Not on file  Occupational History  . Occupation: custodian, bus driver  Tobacco Use  . Smoking status: Former Smoker    Types: Cigarettes    Last attempt to quit: 07/31/2013    Years since quitting: 4.3  . Smokeless tobacco: Never Used  . Tobacco comment: Using vapor cigarettes.  Substance and Sexual Activity  . Alcohol use: Yes    Alcohol/week: 4.0 oz    Types: 8 Standard drinks or equivalent per week    Comment: occ.  . Drug use: No  . Sexual activity: Not on file  Other Topics Concern  . 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 CHOLECYSTECTOMY  10/17/12   lap chole  . COLONOSCOPY  02/2017   will need repeat every 5 yrs per pt.  . WISDOM TOOTH EXTRACTION      Family History  Problem Relation Age of Onset  . Arthritis Mother   . Hemophilia Mother   . Irritable bowel syndrome Mother   . Colon cancer Father        first diagnosed at age 13, symptoms at 33  . Acute myelogenous leukemia Maternal Grandfather   . Diabetes Paternal Grandfather   . Colon cancer Paternal Grandfather        Dx 89s; currently 57  . Heart disease Paternal Grandfather   . Colon cancer Other        pat grandfather's mother  . Cancer Other        several siblings of paternal grandfather; unk. types of cancer    No Known Allergies  Current  Outpatient Medications on File Prior to Visit  Medication Sig Dispense Refill  . fluticasone (FLONASE) 50 MCG/ACT nasal spray Place 2 sprays into both nostrils daily. 16 g 1  . Multiple Vitamins-Minerals (MULTIVITAMIN WITH MINERALS) tablet Take 1 tablet by mouth daily.     Current Facility-Administered Medications on File Prior to Visit  Medication Dose Route Frequency Provider Last Rate Last Dose  . 0.9 %  sodium chloride infusion  500 mL Intravenous Continuous Danis, Estill Cotta III, MD        BP 116/67 (BP Location: Left Arm, Patient Position: Sitting, Cuff Size: Normal)   Pulse 83   Temp 98.6 F (37 C) (Oral)   Resp 16   Ht '5\' 5"'  (1.651 m)   Wt 168 lb (76.2 kg)   LMP 11/01/2017   SpO2 100%   BMI 27.96 kg/m       Objective:   Physical Exam  Constitutional: She appears well-developed and well-nourished.  Cardiovascular: Normal rate, regular rhythm and normal heart sounds.  No murmur heard. Pulmonary/Chest: Effort  normal and breath sounds normal. No respiratory distress. She has no wheezes.  Psychiatric: She has a normal mood and affect. Her behavior is normal. Judgment and thought content normal.  Vagina: normal external exam, some white vaginal discharge is noted        Assessment & Plan:  Vaginitis- swab obtained, will send for bv, yeast, GC/Chlamydia. Suspect BV. Plan to treat pending review of results.

## 2017-12-02 LAB — CERVICOVAGINAL ANCILLARY ONLY
BACTERIAL VAGINITIS: NEGATIVE
CANDIDA VAGINITIS: NEGATIVE
Chlamydia: NEGATIVE
Neisseria Gonorrhea: NEGATIVE

## 2017-12-09 ENCOUNTER — Encounter: Payer: Self-pay | Admitting: Family

## 2017-12-09 MED ORDER — METRONIDAZOLE 0.75 % VA GEL: 1.0000 | Freq: Every day | VAGINAL | 0 refills | Status: AC

## 2018-05-02 ENCOUNTER — Encounter: Payer: Self-pay | Admitting: Family

## 2018-05-03 MED ORDER — LORATADINE 10 MG PO TABS: 10.0000 mg | ORAL_TABLET | Freq: Every day | ORAL | 1 refills | Status: DC

## 2018-06-12 ENCOUNTER — Encounter: Payer: Self-pay | Admitting: Family

## 2018-06-14 MED ORDER — NORETHIN ACE-ETH ESTRAD-FE 1.5-30 MG-MCG PO TABS: 1.0000 | ORAL_TABLET | Freq: Every day | ORAL | 2 refills | Status: DC

## 2018-06-14 NOTE — Telephone Encounter (Signed)
Tiffany Lyons -- please advise what birth control pt should be using? It is no longer on her active medication list.

## 2018-07-11 ENCOUNTER — Ambulatory Visit (INDEPENDENT_AMBULATORY_CARE_PROVIDER_SITE_OTHER): Payer: BC Managed Care – PPO | Admitting: Family

## 2018-07-11 ENCOUNTER — Encounter: Payer: Self-pay | Admitting: Family

## 2018-07-11 VITALS — BP 118/69 | HR 78 | Temp 98.8°F | Resp 16 | Ht 65.0 in | Wt 162.8 lb

## 2018-07-11 DIAGNOSIS — Z0000 Encounter for general adult medical examination without abnormal findings: Secondary | ICD-10-CM | POA: Diagnosis not present

## 2018-07-11 NOTE — Patient Instructions (Signed)
Please complete lab work prior to leaving.   Continue to work on [employer] and exercise.

## 2018-07-11 NOTE — Progress Notes (Signed)
Subjective:    Patient ID: Tiffany Lyons, female    DOB: [birth year], 32 y.o.   MRN: [id number]  HPI  Tiffany Lyons is a 32 yr old female who presents today for a complete physical.  Immunizations:  Flu shot through work, tetanus up to date Diet: has been improving. Wt Readings from Last 3 Encounters:  07/11/18 162 lb 12.8 oz (73.8 kg)  12/01/17 168 lb (76.2 kg)  11/03/17 170 lb (77.1 kg)  Exercise: not regularly Pap Smear: 04/30/17 Dental: will schedule Vision: 2 yrs ago   Review of Systems  Constitutional: Negative for unexpected weight change.  HENT: Negative for hearing loss and rhinorrhea.   Eyes: Negative for visual disturbance.  Respiratory: Negative for cough.   Cardiovascular: Negative for leg swelling.  Gastrointestinal: Negative for constipation and diarrhea.  Genitourinary: Negative for dysuria and frequency.  Musculoskeletal: Negative for arthralgias and myalgias.  Skin: Negative for rash.  Neurological: Negative for headaches.  Hematological: Negative for adenopathy.  Psychiatric/Behavioral:       Denies depression/anxiety   Past Medical History:  Diagnosis Date  . Depression   . Family history of colon cancer   . Gallstones   . Genetic testing 11/25/2016   Test Results: No pathogenic mutations detected.  Genes Analyzed: 43 genes on [facility]'s Common Cancers panel (APC, ATM, AXIN2, BARD1, BMPR1A, BRCA1, BRCA2, BRIP1, CDH1, CDKN2A, CHEK2, DICER1, EPCAM, GREM1, HOXB13, KIT, MEN1, MLH1, MSH2, MSH6, MUTYH, NBN, NF1, PALB2, PDGFRA, PMS2, POLD1, POLE, PTEN, RAD50, RAD51C, RAD51D, SDHA, SDHB, SDHC, SDHD, SMAD4, SMARCA4, STK11, TP53, TSC1, TSC2, VHL).  . History of kidney stones      Social History   Socioeconomic History  . Marital status: Married    Spouse name: Not on file  . Number of children: 1  . Years of education: Not on file  . Highest education level: Not on file  Occupational History  . Occupation: custodian, bus driver  Social Needs  . Financial  resource strain: Not on file  . Food insecurity:    Worry: Not on file    Inability: Not on file  . Transportation needs:    Medical: Not on file    Non-medical: Not on file  Tobacco Use  . Smoking status: Former Smoker    Types: Cigarettes    Last attempt to quit: 07/31/2013    Years since quitting: 4.9  . Smokeless tobacco: Never Used  . Tobacco comment: Using vapor cigarettes.  Substance and Sexual Activity  . Alcohol use: Yes    Alcohol/week: 8.0 standard drinks    Types: 8 Standard drinks or equivalent per week    Comment: occ.  . Drug use: No  . Sexual activity: Not on file  Lifestyle  . Physical activity:    Days per week: Not on file    Minutes per session: Not on file  . Stress: Not on file  Relationships  . Social connections:    Talks on phone: Not on file    Gets together: Not on file    Attends religious service: Not on file    Active member of club or organization: Not on file    Attends meetings of clubs or organizations: Not on file    Relationship status: Not on file  . Intimate partner violence:    Fear of current or ex partner: Not on file    Emotionally abused: Not on file    Physically abused: Not on file    Forced sexual activity: Not on  file  Other Topics Concern  . 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 CHOLECYSTECTOMY  10/17/12   lap chole  . COLONOSCOPY  02/2017   will need repeat every 5 yrs per pt.  . WISDOM TOOTH EXTRACTION      Family History  Problem Relation Age of Onset  . Arthritis Mother   . Hemophilia Mother   . Irritable bowel syndrome Mother   . Colon cancer Father        first diagnosed at age 74, symptoms at 29  . Acute myelogenous leukemia Maternal Grandfather   . Diabetes  Paternal Grandfather   . Colon cancer Paternal Grandfather        Dx 78s; currently 95  . Heart disease Paternal Grandfather   . Colon cancer Other        pat grandfather's mother  . Cancer Other        several siblings of paternal grandfather; unk. types of cancer    No Known Allergies  Current Outpatient Medications on File Prior to Visit  Medication Sig Dispense Refill  . fluticasone (FLONASE) 50 MCG/ACT nasal spray Place 2 sprays into both nostrils daily. 16 g 1  . loratadine (CLARITIN) 10 MG tablet Take 1 tablet (10 mg total) by mouth daily. 90 tablet 1  . Multiple Vitamins-Minerals (MULTIVITAMIN WITH MINERALS) tablet Take 1 tablet by mouth daily.    . norethindrone-ethinyl estradiol-iron (MICROGESTIN FE,GILDESS FE,LOESTRIN FE) 1.5-30 MG-MCG tablet Take 1 tablet by mouth daily. 1 Package 2   Current Facility-Administered Medications on File Prior to Visit  Medication Dose Route Frequency Provider Last Rate Last Dose  . 0.9 %  sodium chloride infusion  500 mL Intravenous Continuous Danis, Estill Cotta III, MD        BP 118/69 (BP Location: Right Arm, Patient Position: Sitting, Cuff Size: Small)   Pulse 78   Temp 98.8 F (37.1 C) (Oral)   Resp 16   Ht _0  (1.651 m)   Wt 162 lb 12.8 oz (73.8 kg)   LMP 07/06/2018   SpO2 100%   BMI 27.09 kg/m       Objective:   Physical Exam Physical Exam  Constitutional: She is oriented to person, place, and time. She appears well-developed and well-nourished. No distress.  HENT:  Head: Normocephalic and atraumatic.  Right Ear: Tympanic membrane and ear canal normal.  Left Ear: Tympanic membrane and ear canal normal.  Mouth/Throat: Oropharynx is clear and moist.  Eyes: Pupils are equal, round, and reactive to light. No scleral icterus.  Neck: Normal range of motion. No thyromegaly present.  Cardiovascular: Normal rate and regular rhythm.   No murmur heard. Pulmonary/Chest: Effort normal and breath sounds normal. No respiratory  distress. He has no wheezes. She has no rales. She exhibits no tenderness.  Abdominal: Soft. Bowel sounds are normal. She exhibits no distension and no mass. There is no tenderness. There is no rebound and no guarding.  Musculoskeletal: She exhibits no edema.  Lymphadenopathy:    She has no cervical adenopathy.  Neurological: She is alert and oriented to person, place, and time. She has normal patellar  reflexes. She exhibits normal muscle tone. Coordination normal.  Skin: Skin is warm and dry.  Psychiatric: She has a normal mood and affect. Her behavior is normal. Judgment and thought content normal.  Breasts: Examined lying Right: Without masses, retractions, discharge or axillary adenopathy.  Left: Without masses, retractions, discharge or axillary adenopathy.  Pelvic: deferred       Assessment & Plan:  Preventative care-discussed healthy diet, exercise. Obtain routine lab work. Tetanus up to date. She will get flu shot from work. Pap up to date.         Assessment & Plan:

## 2018-07-12 LAB — LIPID PANEL
CHOL/HDL RATIO: 2
Cholesterol: 159 mg/dL (ref 0–200)
HDL: 81.3 mg/dL (ref >39.00)
LDL CALC: 54 mg/dL (ref 0–99)
NonHDL: 77.96
TRIGLYCERIDES: 120 mg/dL (ref 0.0–149.0)
VLDL: 24 mg/dL (ref 0.0–40.0)

## 2018-07-12 LAB — CBC WITH DIFFERENTIAL/PLATELET
Basophils Absolute: 0 10*3/uL (ref 0.0–0.1)
Basophils Relative: 0.4 % (ref 0.0–3.0)
EOS ABS: 0.1 10*3/uL (ref 0.0–0.7)
Eosinophils Relative: 0.6 % (ref 0.0–5.0)
HCT: 34.7 % — ABNORMAL LOW (ref 36.0–46.0)
HEMOGLOBIN: 11.7 g/dL — AB (ref 12.0–15.0)
Lymphocytes Relative: 15.6 % (ref 12.0–46.0)
Lymphs Abs: 2 10*3/uL (ref 0.7–4.0)
MCHC: 33.8 g/dL (ref 30.0–36.0)
MCV: 90.9 fl (ref 78.0–100.0)
MONO ABS: 0.8 10*3/uL (ref 0.1–1.0)
Monocytes Relative: 6.3 % (ref 3.0–12.0)
NEUTROS PCT: 77.1 % — AB (ref 43.0–77.0)
Neutro Abs: 9.9 10*3/uL — ABNORMAL HIGH (ref 1.4–7.7)
Platelets: 305 10*3/uL (ref 150.0–400.0)
RBC: 3.82 Mil/uL — AB (ref 3.87–5.11)
RDW: 13.4 % (ref 11.5–15.5)
WBC: 12.8 10*3/uL — AB (ref 4.0–10.5)

## 2018-07-12 LAB — URINALYSIS, ROUTINE W REFLEX MICROSCOPIC
Bilirubin Urine: NEGATIVE
Hgb urine dipstick: NEGATIVE
KETONES UR: NEGATIVE
Leukocytes, UA: NEGATIVE
Nitrite: NEGATIVE
PH: 6 (ref 5.0–8.0)
RBC / HPF: NONE SEEN (ref 0–?)
Total Protein, Urine: NEGATIVE
Urine Glucose: NEGATIVE
Urobilinogen, UA: 0.2 (ref 0.0–1.0)

## 2018-07-12 LAB — TSH: TSH: 2.73 u[IU]/mL (ref 0.35–4.50)

## 2018-07-12 LAB — BASIC METABOLIC PANEL
BUN: 10 mg/dL (ref 6–23)
CALCIUM: 9.5 mg/dL (ref 8.4–10.5)
CO2: 26 mEq/L (ref 19–32)
CREATININE: 0.75 mg/dL (ref 0.40–1.20)
Chloride: 103 mEq/L (ref 96–112)
GFR: 94.92 mL/min (ref >60.00)
Glucose, Bld: 86 mg/dL (ref 70–99)
Potassium: 3.8 mEq/L (ref 3.5–5.1)
Sodium: 138 mEq/L (ref 135–145)

## 2018-07-12 LAB — HEPATIC FUNCTION PANEL
ALT: 19 U/L (ref 0–35)
AST: 15 U/L (ref 0–37)
Albumin: 4.3 g/dL (ref 3.5–5.2)
Alkaline Phosphatase: 78 U/L (ref 39–117)
BILIRUBIN DIRECT: 0.1 mg/dL (ref 0.0–0.3)
BILIRUBIN TOTAL: 0.5 mg/dL (ref 0.2–1.2)
Total Protein: 6.9 g/dL (ref 6.0–8.3)

## 2018-07-13 ENCOUNTER — Encounter: Payer: Self-pay | Admitting: Family

## 2018-07-13 ENCOUNTER — Other Ambulatory Visit (INDEPENDENT_AMBULATORY_CARE_PROVIDER_SITE_OTHER): Payer: BC Managed Care – PPO

## 2018-07-13 DIAGNOSIS — D649 Anemia, unspecified: Secondary | ICD-10-CM

## 2018-07-13 LAB — FERRITIN: FERRITIN: 37.1 ng/mL (ref 10.0–291.0)

## 2018-07-13 LAB — IRON: Iron: 70 ug/dL (ref 42–145)

## 2018-07-13 LAB — VITAMIN B12: Vitamin B-12: 125 pg/mL — ABNORMAL LOW (ref 211–911)

## 2018-07-13 NOTE — Progress Notes (Signed)
Order faxed to [facility] lab

## 2018-07-14 MED ORDER — CYANOCOBALAMIN 1000 MCG/ML IJ SOLN: INTRAMUSCULAR | 0 refills | Status: DC

## 2018-07-14 NOTE — Telephone Encounter (Signed)
Called pt and LVM informing pt that Melissa would like her to schedule a B12 shot. Advised pt to call back and schedule a nurse visit at her convenience.

## 2018-07-14 NOTE — Telephone Encounter (Signed)
Tiffany Lyons, can you please call pt to schedule a b12 shot with nurse?

## 2018-07-14 NOTE — Telephone Encounter (Signed)
Copied from [place] [id number]. Topic: General - Other >> Jul 14, 2018  9:54 AM Tiffany Lyons wrote: Reason for CRM: patient is requesting to have her b12 shot, but can not  get off work until about 4:30pm. Says she would like to know what she can do, and if maybe she can come in on Lyons late day.   Please advise

## 2018-07-25 NOTE — Telephone Encounter (Signed)
Pt called in and would like to know if she could be worked in for the nurse visit this Friday at 2? 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

## 2018-07-28 ENCOUNTER — Telehealth: Payer: Self-pay

## 2018-07-28 NOTE — Telephone Encounter (Signed)
Copied from [place] [id number]. Topic: Appointment Scheduling - Scheduling Inquiry for Clinic >> Jul 28, 2018  2:48 PM Lennox Solders wrote: Reason for CRM: There is mychart message pt sent to [facility].  Pt has been sch for b12 injection on 08-12-18.

## 2018-08-12 ENCOUNTER — Ambulatory Visit (INDEPENDENT_AMBULATORY_CARE_PROVIDER_SITE_OTHER): Payer: BC Managed Care – PPO

## 2018-08-12 DIAGNOSIS — E538 Deficiency of other specified B group vitamins: Secondary | ICD-10-CM | POA: Diagnosis not present

## 2018-08-12 MED ORDER — CYANOCOBALAMIN 1000 MCG/ML IJ SOLN
1000.0000 ug | Freq: Once | INTRAMUSCULAR | Status: AC
  Administered 2018-08-12: 1000 ug via INTRAMUSCULAR

## 2018-08-12 NOTE — Progress Notes (Signed)
Reviewed  Tiffany R Lowne Chase, DO  

## 2018-08-12 NOTE — Progress Notes (Signed)
Pre visit review using our clinic tool,if applicable. No additional management support is needed unless otherwise documented below in the visit note.   Pt here for monthly B12 injection per order from M. Edwena Blow NP.  B12 1000 mcg given IM, and pt tolerated injection well. This is patients first B12 injection. No complaints voiced.  Next B12 injection scheduled for 1 week.

## 2018-08-12 NOTE — Progress Notes (Signed)
Tiffany Lyons S O'Sullivan NP 

## 2018-08-19 ENCOUNTER — Ambulatory Visit (INDEPENDENT_AMBULATORY_CARE_PROVIDER_SITE_OTHER): Payer: BC Managed Care – PPO

## 2018-08-19 DIAGNOSIS — E538 Deficiency of other specified B group vitamins: Secondary | ICD-10-CM | POA: Diagnosis not present

## 2018-08-19 MED ORDER — CYANOCOBALAMIN 1000 MCG/ML IJ SOLN
1000.0000 ug | Freq: Once | INTRAMUSCULAR | Status: AC
  Administered 2018-08-19: 1000 ug via INTRAMUSCULAR

## 2018-08-19 NOTE — Progress Notes (Addendum)
Pre visit review using our clinic tool,if applicable. No additional management support is needed unless otherwise documented below in the visit note.  Pt here for monthly B12 injection per   B12 1049mcg given IM right deltoid, and pt tolerated injection well.  Next B12 injection scheduled for 1 month.   Kathlene November, MD

## 2018-08-20 ENCOUNTER — Emergency Department (HOSPITAL_BASED_OUTPATIENT_CLINIC_OR_DEPARTMENT_OTHER)
Admission: EM | Admit: 2018-08-20 | Discharge: 2018-08-20 | Disposition: A | Discharge status: Home / Self Care | Payer: BC Managed Care – PPO | Attending: Emergency Medicine | Admitting: Emergency Medicine

## 2018-08-20 ENCOUNTER — Emergency Department (HOSPITAL_BASED_OUTPATIENT_CLINIC_OR_DEPARTMENT_OTHER): Payer: BC Managed Care – PPO

## 2018-08-20 ENCOUNTER — Encounter (HOSPITAL_BASED_OUTPATIENT_CLINIC_OR_DEPARTMENT_OTHER): Payer: Self-pay | Admitting: Adult Health

## 2018-08-20 ENCOUNTER — Other Ambulatory Visit: Payer: Self-pay

## 2018-08-20 DIAGNOSIS — R0789 Other chest pain: Secondary | ICD-10-CM | POA: Insufficient documentation

## 2018-08-20 DIAGNOSIS — Z87891 Personal history of nicotine dependence: Secondary | ICD-10-CM | POA: Insufficient documentation

## 2018-08-20 DIAGNOSIS — R079 Chest pain, unspecified: Secondary | ICD-10-CM | POA: Diagnosis present

## 2018-08-20 DIAGNOSIS — Z79899 Other long term (current) drug therapy: Secondary | ICD-10-CM | POA: Diagnosis not present

## 2018-08-20 HISTORY — DX: Deficiency of other specified B group vitamins: E53.8

## 2018-08-20 LAB — BASIC METABOLIC PANEL
Anion gap: 9 (ref 5–15)
BUN: 9 mg/dL (ref 6–20)
CHLORIDE: 106 mmol/L (ref 98–111)
CO2: 24 mmol/L (ref 22–32)
CREATININE: 0.71 mg/dL (ref 0.44–1.00)
Calcium: 8.9 mg/dL (ref 8.9–10.3)
GFR calc Af Amer: >60 mL/min (ref >60)
GFR calc non Af Amer: >60 mL/min (ref >60)
GLUCOSE: 110 mg/dL — AB (ref 70–99)
Potassium: 3.6 mmol/L (ref 3.5–5.1)
Sodium: 139 mmol/L (ref 135–145)

## 2018-08-20 LAB — CBC
HCT: 35.8 % — ABNORMAL LOW (ref 36.0–46.0)
Hemoglobin: 11.8 g/dL — ABNORMAL LOW (ref 12.0–15.0)
MCH: 30.6 pg (ref 26.0–34.0)
MCHC: 33 g/dL (ref 30.0–36.0)
MCV: 93 fL (ref 80.0–100.0)
Platelets: 263 10*3/uL (ref 150–400)
RBC: 3.85 MIL/uL — ABNORMAL LOW (ref 3.87–5.11)
RDW: 12.8 % (ref 11.5–15.5)
WBC: 8.7 10*3/uL (ref 4.0–10.5)
nRBC: 0 % (ref 0.0–0.2)

## 2018-08-20 LAB — TROPONIN I: Troponin I: <0.03 ng/mL (ref <0.03)

## 2018-08-20 LAB — D-DIMER, QUANTITATIVE (NOT AT ARMC)

## 2018-08-20 LAB — HCG, SERUM, QUALITATIVE: Preg, Serum: NEGATIVE

## 2018-08-20 MED ORDER — IBUPROFEN 400 MG PO TABS
400.0000 mg | ORAL_TABLET | Freq: Once | ORAL | Status: AC
  Administered 2018-08-20: 400 mg via ORAL
  Filled 2018-08-20: qty 1

## 2018-08-20 NOTE — ED Notes (Signed)
Pt ambulated to d/c window with steady gait 

## 2018-08-20 NOTE — ED Provider Notes (Signed)
[facility] Provider Note   CSN: [id number] Arrival date & time: 08/20/18  2117     History   Chief Complaint Chief Complaint  Patient presents with  . Chest Pain    HPI Kymberly Blomberg is a 32 y.o. female.  Patient is a 32 year old female with a history of depression, vitamin B12 deficiency presenting today with chest pain.  Patient states she got her second B12 injection yesterday and not long after that she developed a temperature of 100.1.  She states she does not feel like her body is quite her own which is a similar symptom she felt with her last injection.  However today when she was sitting on the couch she also developed left-sided chest discomfort that radiates into her shoulder blade.  Nothing seems to make it worse.  Exertion, deep breathing, arm movement, palpation do not affect the discomfort.  She states it is mild and 4 out of 10.  She is not short of breath, no cough, nausea or vomiting.  Denies any abdominal pain or symptoms associated with eating.  The history is provided by the patient.  Chest Pain   This is a new problem. The current episode started 6 to 12 hours ago. The problem occurs constantly. The problem has not changed since onset.Associated with: started spontaneously while watching TV but has not changed. The pain is present in the lateral region. The pain is at a severity of 4/10. The pain is moderate. The quality of the pain is described as heavy and pressure-like. The pain radiates to the upper back. Duration of episode(s) is 6 hours. Associated symptoms include a fever. Pertinent negatives include no abdominal pain, no cough, no exertional chest pressure, no leg pain, no lower extremity edema, no nausea, no near-syncope, no palpitations, no shortness of breath, no sputum production, no vomiting and no weakness. Associated symptoms comments: Fever of 100 yesterday after B12 shot but none today. She has tried nothing for the symptoms.  The treatment provided no relief. Risk factors include oral contraceptive use.  Pertinent negatives for past medical history include no CAD, no diabetes, no DVT and no PE.  Pertinent negatives for family medical history include: no PE.    Past Medical History:  Diagnosis Date  . Depression   . Family history of colon cancer   . Gallstones   . Genetic testing 11/25/2016   Test Results: No pathogenic mutations detected.  Genes Analyzed: 43 genes on [employer]'s Common Cancers panel (APC, ATM, AXIN2, BARD1, BMPR1A, BRCA1, BRCA2, BRIP1, CDH1, CDKN2A, CHEK2, DICER1, EPCAM, GREM1, HOXB13, KIT, MEN1, MLH1, MSH2, MSH6, MUTYH, NBN, NF1, PALB2, PDGFRA, PMS2, POLD1, POLE, PTEN, RAD50, RAD51C, RAD51D, SDHA, SDHB, SDHC, SDHD, SMAD4, SMARCA4, STK11, TP53, TSC1, TSC2, VHL).  . History of kidney stones   . Vitamin B12 deficiency     Patient Active Problem List   Diagnosis Date Noted  . Genetic testing 11/25/2016  . Family history of colon cancer   . Iron deficiency 08/02/2014  . Routine general medical examination at a health care facility 07/17/2013  . Abdominal pain, acute, right upper quadrant 09/28/2012  . Depression 09/28/2012    Past Surgical History:  Procedure Laterality Date  . CHOLECYSTECTOMY  10/17/12   lap chole  . COLONOSCOPY  02/2017   will need repeat every 5 yrs per pt.  . WISDOM TOOTH EXTRACTION       OB History    Gravida  4   Para  1   Term  1   Preterm      AB  3   Living        SAB      TAB      Ectopic      Multiple      Live Births           Obstetric Comments  2 abortions/1 miscarraige         Home Medications    Prior to Admission medications   Medication Sig Start Date End Date Taking? Authorizing Provider  cyanocobalamin (,VITAMIN B-12,) 1000 MCG/ML injection 1042mg IM weekly x 4 weeks, then once monthly. 07/14/18  Yes ODebbrah Alar NP  fluticasone (FLONASE) 50 MCG/ACT nasal spray Place 2 sprays into both nostrils daily. 11/03/17   Yes Saguier, EPercell Miller PA-C  loratadine (CLARITIN) 10 MG tablet Take 1 tablet (10 mg total) by mouth daily. 05/03/18  Yes ODebbrah Alar NP  Multiple Vitamins-Minerals (MULTIVITAMIN WITH MINERALS) tablet Take 1 tablet by mouth daily. 05/13/16  Yes ODebbrah Alar NP  norethindrone-ethinyl estradiol-iron (MICROGESTIN FE,GILDESS FE,LOESTRIN FE) 1.5-30 MG-MCG tablet Take 1 tablet by mouth daily. 06/14/18  Yes ODebbrah Alar NP    Family History Family History  Problem Relation Age of Onset  . Arthritis Mother   . Hemophilia Mother   . Irritable bowel syndrome Mother   . Colon cancer Father        first diagnosed at age 32 symptoms at 330 . Acute myelogenous leukemia Maternal Grandfather   . Diabetes Paternal Grandfather   . Colon cancer Paternal Grandfather        Dx 551s currently 666 . Heart disease Paternal Grandfather   . Colon cancer Other        pat grandfather's mother  . Cancer Other        several siblings of paternal grandfather; unk. types of cancer    Social History Social History   Tobacco Use  . Smoking status: Former Smoker    Types: Cigarettes    Last attempt to quit: 07/31/2013    Years since quitting: 5.0  . Smokeless tobacco: Never Used  . Tobacco comment: Using vapor cigarettes.  Substance Use Topics  . Alcohol use: Yes    Alcohol/week: 8.0 standard drinks    Types: 8 Standard drinks or equivalent per week    Comment: occ.  . Drug use: No     Allergies   Patient has no known allergies.   Review of Systems Review of Systems  Constitutional: Positive for fever.  Respiratory: Negative for cough, sputum production and shortness of breath.   Cardiovascular: Positive for chest pain. Negative for palpitations and near-syncope.  Gastrointestinal: Negative for abdominal pain, nausea and vomiting.  Neurological: Negative for weakness.  All other systems reviewed and are negative.    Physical Exam Updated Vital Signs BP 128/86 (BP  Location: Left Arm)   Pulse 80   Temp 99.9 F (37.7 C) (Oral)   Resp 18   LMP 08/01/2018 (Approximate)   SpO2 100%   Physical Exam  Constitutional: She is oriented to person, place, and time. She appears well-developed and well-nourished. No distress.  HENT:  Head: Normocephalic and atraumatic.  Mouth/Throat: Oropharynx is clear and moist.  Eyes: Pupils are equal, round, and reactive to light. Conjunctivae and EOM are normal.  Neck: Normal range of motion. Neck supple.  Cardiovascular: Normal rate, regular rhythm and intact distal pulses.  No murmur heard. Pulmonary/Chest: Effort normal and breath sounds normal. No respiratory distress. She  has no wheezes. She has no rales. She exhibits no tenderness.  Pain not reproduced by movement of the arm or deep breathing  Abdominal: Soft. She exhibits no distension. There is no tenderness. There is no rebound and no guarding.  Musculoskeletal: Normal range of motion. She exhibits no edema or tenderness.  Neurological: She is alert and oriented to person, place, and time.  Skin: Skin is warm and dry. No rash noted. No erythema.  Psychiatric: She has a normal mood and affect. Her behavior is normal.  Nursing note and vitals reviewed.    ED Treatments / Results  Labs (all labs ordered are listed, but only abnormal results are displayed) Labs Reviewed  CBC - Abnormal; Notable for the following components:      Result Value   RBC 3.85 (*)    Hemoglobin 11.8 (*)    HCT 35.8 (*)    All other components within normal limits  BASIC METABOLIC PANEL - Abnormal; Notable for the following components:   Glucose, Bld 110 (*)    All other components within normal limits  TROPONIN I  D-DIMER, QUANTITATIVE (NOT AT [facility])  HCG, SERUM, QUALITATIVE    EKG EKG Interpretation  Date/Time:  Saturday August 20 2018 21:29:15 EST Ventricular Rate:  79 PR Interval:    QRS Duration: 78 QT Interval:  361 QTC Calculation: 414 R Axis:   68 Text  Interpretation:  Sinus rhythm Consider left atrial enlargement No previous tracing Confirmed by Blanchie Dessert [phone]) on 08/20/2018 9:43:11 PM   Radiology Dg Chest 2 View  Result Date: 08/20/2018 CLINICAL DATA:  Chest pain. EXAM: CHEST - 2 VIEW COMPARISON:  Radiographs of August 20, 2015. FINDINGS: The heart size and mediastinal contours are within normal limits. Both lungs are clear. No pneumothorax or pleural effusion is noted. The visualized skeletal structures are unremarkable. IMPRESSION: No active cardiopulmonary disease. Electronically Signed   By: Marijo Conception, M.D.   On: 08/20/2018 21:43    Procedures Procedures (including critical care time)  Medications Ordered in ED Medications  ibuprofen (ADVIL,MOTRIN) tablet 400 mg (has no administration in time range)     Initial Impression / Assessment and Plan / ED Course  I have reviewed the triage vital signs and the nursing notes.  Pertinent labs & imaging results that were available during my care of the patient were reviewed by me and considered in my medical decision making (see chart for details).     Patient is a 32 year old female presenting today with atypical chest pain that started spontaneously while she was watching TV.  Only thing that is out of the ordinary was getting a B12 shot yesterday.  This is the second injection she is received.  She states the first 1 made her feel little bit unusual and yesterday after getting the injection she had a low-grade fever and did not feel great but the pain did not start until today.  She has no other associated symptoms except it does radiate into her shoulder blade.  Patient is on oral contraceptives but does not smoke has not had unilateral leg pain or swelling and has no family history of clotting disorder.  Her mother is a hemophiliac but she does not have that gene.  She denies any URI symptoms such as cough, congestion, runny nose.  Temperature today is 99.9.  Unclear if her  B12 injection caused hypokalemia that might be causing her pain.  She does not have any typical allergic reaction symptoms.  She is  a low risk Wells but does take OCPs and d-dimer pending.  EKG without acute findings.  CXR wnl.  11:18 PM D-dimer wnl.  Trop and BMP without acute findings.  CBC without acute finding.  Atypical chest pain.  Results discussed with pt and she will f/u with pcp.  Final Clinical Impressions(s) / ED Diagnoses   Final diagnoses:  Atypical chest pain    ED Discharge Orders    None       Blanchie Dessert, MD 08/20/18 2318

## 2018-08-20 NOTE — ED Notes (Signed)
Patient transported to X-ray 

## 2018-08-20 NOTE — ED Notes (Signed)
PIV attempt x 1. Blood return noted and labs obtained but unable to thread catheter

## 2018-08-20 NOTE — Discharge Instructions (Signed)
Take tylenol/ibuprofen as needed for pain.  If you develop shortness of breath, worsening of the pain, ongoing coughing or other concerns follow-up with your doctor early this week.

## 2018-08-20 NOTE — ED Triage Notes (Signed)
Pt received vitamin B12 shot yesterday, while at the office she was not feeling well and she reports that she had a fever of 100/.1 with nauseas. Today she endorses fatigue, and sleepiness. At 7 pm reports feeling constant sharp left sided chest pain. She denies cough, denies SOB. PAin does not radiate. Nothing makes it beeter, nothing makes it worse.

## 2018-08-22 ENCOUNTER — Telehealth: Payer: Self-pay | Admitting: Family

## 2018-08-22 ENCOUNTER — Ambulatory Visit: Payer: BC Managed Care – PPO

## 2018-08-22 NOTE — Telephone Encounter (Signed)
Copied from [place] [id number]. Topic: Quick Communication - See Telephone Encounter >> Aug 22, 2018 11:07 AM Blase Mess A wrote: CRM for notification. See Telephone encounter for: 08/22/18.  Patient is calling to get advice for her appt on 08/26/18 at 4:00p-Patient has an appt to get her B-12 injection. Patient went to the ER on Saturday 08/20/18 for possible reaction to B-12 injection. Patient wants to know should she come in for another B-12 inj. Her Hospital follow up is scheduled for Tuesday August 30, 2018

## 2018-08-22 NOTE — Telephone Encounter (Signed)
Noted and agree. 

## 2018-08-22 NOTE — Telephone Encounter (Signed)
Spoke with pt and cancelled B12 scheduled for 08/26/18 until pt sees PCP for ER follow up on 08/30/18 and we can get further clarification at that time.

## 2018-08-26 ENCOUNTER — Ambulatory Visit: Payer: BC Managed Care – PPO

## 2018-08-30 ENCOUNTER — Ambulatory Visit: Payer: BC Managed Care – PPO | Admitting: Family

## 2018-08-30 ENCOUNTER — Encounter: Payer: Self-pay | Admitting: Family

## 2018-08-30 VITALS — BP 115/71 | HR 78 | Temp 98.9°F | Resp 16 | Ht 65.0 in | Wt 160.0 lb

## 2018-08-30 DIAGNOSIS — E538 Deficiency of other specified B group vitamins: Secondary | ICD-10-CM | POA: Diagnosis not present

## 2018-08-30 DIAGNOSIS — R0789 Other chest pain: Secondary | ICD-10-CM | POA: Diagnosis not present

## 2018-08-30 DIAGNOSIS — Z23 Encounter for immunization: Secondary | ICD-10-CM

## 2018-08-30 MED ORDER — DICLOFENAC SODIUM 75 MG PO TBEC: 75.0000 mg | DELAYED_RELEASE_TABLET | Freq: Two times a day (BID) | ORAL | 0 refills | Status: DC

## 2018-08-30 MED ORDER — B-12 500 MCG PO TABS: 1.0000 | ORAL_TABLET | Freq: Every day | ORAL | Status: DC

## 2018-08-30 NOTE — Patient Instructions (Addendum)
Please begin diclofenac (anti-inflammatory) twice daily for the next 1 week then as needed. You should be contacted about scheduling your echocardiogram and your visit with the cardiologist.  Go to the ER if you develop severe or worsening chest pain or shortness of breath.  Add oral b12 557mcg once daily.

## 2018-08-30 NOTE — Progress Notes (Signed)
Subjective:    Patient ID: Tiffany Lyons, female    DOB: [birth year], 32 y.o.   MRN: [id number]  HPI  Patient is a 32 yr old female who presents today for ER follow up.  ER work-up record is reviewed.  She was seen in the ED on 08/20/2018.    She presented following her second B12 injection that day and developed a temperature of 100.1.  She reported a sensation that her body did not feel quite like her own which was similar to the sensation she had after her first B12 injection.  She then developed left-sided chest discomfort which radiated into her shoulder pain blade. ER work-up included a CBC which noted mild anemia, normal troponin, normal d-dimer, and negative hCG.  Reports that she has had left sided chest pain- radiates through to her back.  Not worsened by a deep breath or movements.  Pain is 3/10. Not sore to the touch.  Denies cough/fever.  Denies wheezing.     Review of Systems  See HPI  Past Medical History:  Diagnosis Date  . Depression   . Family history of colon cancer   . Gallstones   . Genetic testing 11/25/2016   Test Results: No pathogenic mutations detected.  Genes Analyzed: 43 genes on [employer]'s Common Cancers panel (APC, ATM, AXIN2, BARD1, BMPR1A, BRCA1, BRCA2, BRIP1, CDH1, CDKN2A, CHEK2, DICER1, EPCAM, GREM1, HOXB13, KIT, MEN1, MLH1, MSH2, MSH6, MUTYH, NBN, NF1, PALB2, PDGFRA, PMS2, POLD1, POLE, PTEN, RAD50, RAD51C, RAD51D, SDHA, SDHB, SDHC, SDHD, SMAD4, SMARCA4, STK11, TP53, TSC1, TSC2, VHL).  . History of kidney stones   . Vitamin B12 deficiency      Social History   Socioeconomic History  . Marital status: Married    Spouse name: Not on file  . Number of children: 1  . Years of education: Not on file  . Highest education level: Not on file  Occupational History  . Occupation: custodian, bus driver  Social Needs  . Financial resource strain: Not on file  . Food insecurity:    Worry: Not on file    Inability: Not on file  . Transportation needs:   Medical: Not on file    Non-medical: Not on file  Tobacco Use  . Smoking status: Former Smoker    Types: Cigarettes    Last attempt to quit: 07/31/2013    Years since quitting: 5.0  . Smokeless tobacco: Never Used  . Tobacco comment: Using vapor cigarettes.  Substance and Sexual Activity  . Alcohol use: Yes    Alcohol/week: 8.0 standard drinks    Types: 8 Standard drinks or equivalent per week    Comment: occ.  . Drug use: No  . Sexual activity: Not on file  Lifestyle  . Physical activity:    Days per week: Not on file    Minutes per session: Not on file  . Stress: Not on file  Relationships  . Social connections:    Talks on phone: Not on file    Gets together: Not on file    Attends religious service: Not on file    Active member of club or organization: Not on file    Attends meetings of clubs or organizations: Not on file    Relationship status: Not on file  . Intimate partner violence:    Fear of current or ex partner: Not on file    Emotionally abused: Not on file    Physically abused: Not on file    Forced sexual activity:  Not on file  Other Topics Concern  . 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 CHOLECYSTECTOMY  10/17/12   lap chole  . COLONOSCOPY  02/2017   will need repeat every 5 yrs per pt.  . WISDOM TOOTH EXTRACTION      Family History  Problem Relation Age of Onset  . Arthritis Mother   . Hemophilia Mother   . Irritable bowel syndrome Mother   . Colon cancer Father        first diagnosed at age 49, symptoms at 40  . Acute myelogenous leukemia Maternal Grandfather   . Diabetes Paternal Grandfather   . Colon cancer Paternal Grandfather        Dx 65s; currently 58  . Heart disease Paternal Grandfather   .  Colon cancer Other        pat grandfather's mother  . Cancer Other        several siblings of paternal grandfather; unk. types of cancer    No Known Allergies  Current Outpatient Medications on File Prior to Visit  Medication Sig Dispense Refill  . fluticasone (FLONASE) 50 MCG/ACT nasal spray Place 2 sprays into both nostrils daily. 16 g 1  . loratadine (CLARITIN) 10 MG tablet Take 1 tablet (10 mg total) by mouth daily. 90 tablet 1  . Multiple Vitamins-Minerals (MULTIVITAMIN WITH MINERALS) tablet Take 1 tablet by mouth daily.    . norethindrone-ethinyl estradiol-iron (MICROGESTIN FE,GILDESS FE,LOESTRIN FE) 1.5-30 MG-MCG tablet Take 1 tablet by mouth daily. 1 Package 2   Current Facility-Administered Medications on File Prior to Visit  Medication Dose Route Frequency Provider Last Rate Last Dose  . 0.9 %  sodium chloride infusion  500 mL Intravenous Continuous Danis, Estill Cotta III, MD        BP 115/71 (BP Location: Left Arm, Cuff Size: Normal)   Pulse 78   Temp 98.9 F (37.2 C) (Oral)   Resp 16   Ht '5\' 5"'  (1.651 m)   Wt 160 lb (72.6 kg)   LMP 08/01/2018 (Approximate)   SpO2 100%   BMI 26.63 kg/m        Objective:   Physical Exam  Constitutional: She is oriented to person, place, and time. She appears well-developed and well-nourished. No distress.  Cardiovascular: Normal rate and regular rhythm. Exam reveals no gallop and no friction rub.  No murmur heard. Pulmonary/Chest: Effort normal. No stridor. She has no wheezes. She has no rales.  Musculoskeletal: She exhibits no edema.  No chest wall tenderness  Neurological: She is alert and oriented to person, place, and time.          Assessment & Plan:  Atypical chest pain- EKG tracing is personally reviewed.  EKG notes NSR.  No acute changes. ? Musculoskeletal etiology. Will rx with diclofenac, obtain 2D echo and refer to cardiology for consultation. She is advised to go to the ER if she develops new/worsening chest  pain.  B12 deficiency- advised patient to stop injections and start oral b12 supplement. We can see how her levels do with the oral supplementation.

## 2018-09-02 ENCOUNTER — Ambulatory Visit: Payer: BC Managed Care – PPO

## 2018-09-07 ENCOUNTER — Encounter: Payer: Self-pay | Admitting: Cardiology

## 2018-09-07 ENCOUNTER — Ambulatory Visit: Payer: BC Managed Care – PPO | Admitting: Cardiology

## 2018-09-07 DIAGNOSIS — R0789 Other chest pain: Secondary | ICD-10-CM | POA: Insufficient documentation

## 2018-09-07 NOTE — Progress Notes (Signed)
Cardiology Consultation:    Date:  09/07/2018   ID:  Tiffany Lyons, DOB 1986/04/21, MRN [id number]  PCP:  Debbrah Alar, NP  Cardiologist:  Jenne Campus, MD   Referring MD: Debbrah Alar, NP   Chief Complaint  Patient presents with  . Chest Pain  I am having chest pain  History of Present Illness:    Tiffany Lyons is a 32 y.o. female who is being seen today for the evaluation of chest pain at the request of Debbrah Alar, NP.  She is a healthy young woman that does not have diabetes hypertension high cholesterol is absolutely excellent, she is to smoke but quit years ago sometimes rarely she do some vaping she came to me because of chest pain about 2 weeks she started experiencing chest pain pain is located in the left upper portion of the chest pain is constant is been constant for the last 2 weeks she graded pain 3-4 scale up to 10 she takes some Voltaren with good relief there is no aggravating no relieving factor except for Voltaren.  She can walk climb stairs with no difficulties there is no shortness of breath she does not exercise on the regular basis in any structured fashion but she tried to walk more and she actually lost some weight.  She does not have any family history of premature coronary disease.  Past Medical History:  Diagnosis Date  . Depression   . Family history of colon cancer   . Gallstones   . Genetic testing 11/25/2016   Test Results: No pathogenic mutations detected.  Genes Analyzed: 43 genes on [facility]'s Common Cancers panel (APC, ATM, AXIN2, BARD1, BMPR1A, BRCA1, BRCA2, BRIP1, CDH1, CDKN2A, CHEK2, DICER1, EPCAM, GREM1, HOXB13, KIT, MEN1, MLH1, MSH2, MSH6, MUTYH, NBN, NF1, PALB2, PDGFRA, PMS2, POLD1, POLE, PTEN, RAD50, RAD51C, RAD51D, SDHA, SDHB, SDHC, SDHD, SMAD4, SMARCA4, STK11, TP53, TSC1, TSC2, VHL).  . History of kidney stones   . Vitamin B12 deficiency     Past Surgical History:  Procedure Laterality Date  . CHOLECYSTECTOMY   10/17/12   lap chole  . COLONOSCOPY  02/2017   will need repeat every 5 yrs per pt.  . WISDOM TOOTH EXTRACTION      Current Medications: Current Meds  Medication Sig  . Cyanocobalamin (B-12) 500 MCG TABS Take 1 tablet by mouth daily.  . diclofenac (VOLTAREN) 75 MG EC tablet Take 1 tablet (75 mg total) by mouth 2 (two) times daily.  . fluticasone (FLONASE) 50 MCG/ACT nasal spray Place 2 sprays into both nostrils daily.  Marland Kitchen loratadine (CLARITIN) 10 MG tablet Take 1 tablet (10 mg total) by mouth daily.  . Multiple Vitamins-Minerals (MULTIVITAMIN WITH MINERALS) tablet Take 1 tablet by mouth daily.  . norethindrone-ethinyl estradiol-iron (MICROGESTIN FE,GILDESS FE,LOESTRIN FE) 1.5-30 MG-MCG tablet Take 1 tablet by mouth daily.   Current Facility-Administered Medications for the 09/07/18 encounter (Office Visit) with Park Liter, MD  Medication  . 0.9 %  sodium chloride infusion     Allergies:   Patient has no known allergies.   Social History   Socioeconomic History  . Marital status: Married    Spouse name: Not on file  . Number of children: 1  . Years of education: Not on file  . Highest education level: Not on file  Occupational History  . Occupation: custodian, bus driver  Social Needs  . Financial resource strain: Not on file  . Food insecurity:    Worry: Not on file    Inability:  Not on file  . Transportation needs:    Medical: Not on file    Non-medical: Not on file  Tobacco Use  . Smoking status: Former Smoker    Types: Cigarettes    Last attempt to quit: 07/31/2013    Years since quitting: 5.1  . Smokeless tobacco: Never Used  . Tobacco comment: Using vapor cigarettes.  Substance and Sexual Activity  . Alcohol use: Yes    Alcohol/week: 8.0 standard drinks    Types: 8 Standard drinks or equivalent per week    Comment: occ.  . Drug use: No  . Sexual activity: Not on file  Lifestyle  . Physical activity:    Days per week: Not on file    Minutes per  session: Not on file  . Stress: Not on file  Relationships  . Social connections:    Talks on phone: Not on file    Gets together: Not on file    Attends religious service: Not on file    Active member of club or organization: Not on file    Attends meetings of clubs or organizations: Not on file    Relationship status: Not on file  Other Topics Concern  . 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 ROS:   Please see the history of present illness.    All 14 point review of systems negative except as described per history of present illness.  EKGs/Labs/Other Studies Reviewed:    The following studies were reviewed today:   EKG:  EKG is  ordered today.  The ekg ordered today demonstrates normal sinus rhythm normal P interval normal QS complex duration morphology no ST segment changes  Recent Labs: 07/11/2018: ALT 19; TSH 2.73 08/20/2018: BUN 9; Creatinine, Ser 0.71; Hemoglobin 11.8; Platelets 263; Potassium 3.6; Sodium 139  Recent Lipid Panel    Component Value Date/Time   CHOL 159 07/11/2018 1809   TRIG 120.0 07/11/2018 1809   HDL 81.30 07/11/2018 1809   CHOLHDL 2 07/11/2018 1809   VLDL 24.0 07/11/2018 1809   LDLCALC 54 07/11/2018 1809    Physical Exam:    VS:  BP 132/68   Pulse 78   Ht '5\' 5"'  (1.651 m)   Wt 159 lb (72.1 kg)   SpO2 99%   BMI 26.46 kg/m     Wt Readings from Last 3 Encounters:  09/07/18 159 lb (72.1 kg)    08/30/18 160 lb (72.6 kg)  07/11/18 162 lb 12.8 oz (73.8 kg)     GEN:  Well nourished, well developed in no acute distress HEENT: Normal NECK: No JVD; No carotid bruits LYMPHATICS: No lymphadenopathy CARDIAC: RRR, no murmurs, no rubs, no gallops RESPIRATORY:  Clear to auscultation without rales, wheezing or rhonchi  ABDOMEN: Soft, non-tender, non-distended MUSCULOSKELETAL:  No edema; No deformity  SKIN: Warm and dry NEUROLOGIC:  Alert and oriented x  3 PSYCHIATRIC:  Normal affect   ASSESSMENT:    1. Atypical chest pain    PLAN:    In order of problems listed above:  1. Atypical chest pain.  Does not have any characteristic that would suggest a cardiac pain is a constant sensation for 2 weeks not aggravated by exercise on top of that she practically does not have much risk factors for coronary artery disease she is to smoke but quit years ago.  She can walk climb stairs with no difficulty does not have chest tightness while walking I doubt very much that this is the pain related to her heart.  We were talking about potentially doing stress but she does not want to and honestly I do not think there is any role for it since she is doing well overall and chest pain does not look like a cardiac pain she is taking nonsteroidal anti-inflammatory medication which I recommended to continue in the future she if she still have some chest pain she may require more thorough evaluation that can include even CT of her chest.  However for now I would suggest to continue with nonsteroidal anti-inflammatory medication and hopeful for resolution of symptoms. 2. Cholesterol status absolutely amazing, LDL 54 HDL more than 80.   Medication Adjustments/Labs and Tests Ordered: Current medicines are reviewed at length with the patient today.  Concerns regarding medicines are outlined above.  No orders of the defined types were placed in this encounter.  No orders of the defined types were placed in this  encounter.   Signed, Park Liter, MD, [facility]. 09/07/2018 9:31 AM    [place]

## 2018-09-07 NOTE — Patient Instructions (Signed)
Medication Instructions:  Your physician recommends that you continue on your current medications as directed. Please refer to the Current Medication list given to you today.  If you need a refill on your cardiac medications before your next appointment, please call your pharmacy.   Lab work: None.  If you have labs (blood work) drawn today and your tests are completely normal, you will receive your results only by: Marland Kitchen MyChart Message (if you have MyChart) OR . A paper copy in the mail If you have any lab test that is abnormal or we need to change your treatment, we will call you to review the results.  Testing/Procedures: None.   Follow-Up: At [facility], you and your health needs are our priority.  As part of our continuing mission to provide you with exceptional heart care, we have created designated Provider Care Teams.  These Care Teams include your primary Cardiologist (physician) and Advanced Practice Providers (APPs -  Physician Assistants and Nurse Practitioners) who all work together to provide you with the care you need, when you need it. You will need a follow up appointment in 1 years.  Please call our office 2 months in advance to schedule this appointment.  You may see No primary care provider on file. or another member of our [employer] Provider Team in [place]: Shirlee More, MD . Jyl Heinz, MD  Any Other Special Instructions Will Be Listed Below (If Applicable).

## 2018-09-07 NOTE — Addendum Note (Signed)
Addended by: Ashok Norris on: 09/07/2018 09:40 AM   Modules accepted: Orders

## 2018-09-09 ENCOUNTER — Ambulatory Visit (HOSPITAL_BASED_OUTPATIENT_CLINIC_OR_DEPARTMENT_OTHER)
Admission: RE | Admit: 2018-09-09 | Discharge: 2018-09-09 | Disposition: A | Discharge status: Home / Self Care | Payer: BC Managed Care – PPO | Source: Ambulatory Visit | Attending: Family | Admitting: Family

## 2018-09-09 DIAGNOSIS — R0789 Other chest pain: Secondary | ICD-10-CM | POA: Insufficient documentation

## 2018-09-09 NOTE — Progress Notes (Signed)
  Echocardiogram 2D Echocardiogram has been performed.  Tiffany Lyons T Tiffany Lyons 09/09/2018, 11:23 AM

## 2018-09-10 ENCOUNTER — Encounter: Payer: Self-pay | Admitting: Family

## 2018-10-02 ENCOUNTER — Other Ambulatory Visit: Payer: Self-pay | Admitting: Family

## 2019-02-09 ENCOUNTER — Encounter: Payer: Self-pay | Admitting: Family

## 2019-02-10 ENCOUNTER — Other Ambulatory Visit: Payer: Self-pay

## 2019-02-10 ENCOUNTER — Ambulatory Visit (INDEPENDENT_AMBULATORY_CARE_PROVIDER_SITE_OTHER): Payer: BC Managed Care – PPO | Admitting: Family

## 2019-02-10 DIAGNOSIS — G4700 Insomnia, unspecified: Secondary | ICD-10-CM

## 2019-02-10 DIAGNOSIS — F4321 Adjustment disorder with depressed mood: Secondary | ICD-10-CM

## 2019-02-10 MED ORDER — AMITRIPTYLINE HCL 25 MG PO TABS: 25.0000 mg | ORAL_TABLET | Freq: Every day | ORAL | 1 refills | Status: DC

## 2019-02-10 NOTE — Progress Notes (Signed)
Virtual Visit via Video Note  I connected with Netta Cedars on 02/10/19 at  2:40 PM EDT by a video enabled telemedicine application and verified that I am speaking with the correct person using two identifiers. This visit type was conducted due to national recommendations for restrictions regarding the COVID-19 Pandemic (e.g. social distancing).  This format is felt to be most appropriate for this patient at this time.   I discussed the limitations of evaluation and management by telemedicine and the availability of in person appointments. The patient expressed understanding and agreed to proceed.  Only the patient and myself were on today's video visit. The patient was at home and I was in my office at the time of today's visit.   History of Present Illness:  Patient is a 33 yr old female who presents today with chief complaint of difficulty sleeping. She reports that symptoms began 2 weeks ago.   Reports that she feels  Reports that she walks 20-30 minutes a day.  Caffeine- maybe 1 can soda a day.   Lays down at 10 pm.  Typically falls asleep at 3 AM but sometimes she does not fall asleep at 11 AM the following day.  Reports she sometimes will sleep during the day for a hour or two the next day.  Feels down because she is not having a normal routine.  Trouble motivating. Mild anxiety symptoms but no panic attacks. Denies SI/HI.   Reports that she tried melatonin notes that it did not help.      Observations/Objective:   Gen: Awake, alert, no acute distress Resp: Breathing is even and non-labored Psych: calm/pleasant demeanor Neuro: Alert and Oriented x 3, + facial symmetry, speech is clear.   Assessment and Plan:  Gen: Awake, alert, no acute distress Resp: Breathing is even and non-labored Psych: calm/pleasant demeanor Neuro: Alert and Oriented x 3, + facial symmetry, speech is clear.   Follow Up Instructions:   1) Insomnia- discussed sleep hygiene including avoiding  screen time in the evenings.  Will give trial of elavil to help with sleep.  2) situational depression- exacerbated by her being at home.  Hopefully the addition of elavil will help with her mood as well.   I discussed the assessment and treatment plan with the patient. The patient was provided an opportunity to ask questions and all were answered. The patient agreed with the plan and demonstrated an understanding of the instructions.   The patient was advised to call back or seek an in-person evaluation if the symptoms worsen or if the condition fails to improve as anticipated.    Nance Pear, NP

## 2019-04-07 ENCOUNTER — Ambulatory Visit: Payer: BC Managed Care – PPO | Admitting: Family

## 2019-04-07 ENCOUNTER — Other Ambulatory Visit: Payer: Self-pay

## 2019-04-07 ENCOUNTER — Encounter: Payer: Self-pay | Admitting: Family

## 2019-04-07 VITALS — BP 110/75 | HR 97 | Temp 98.7°F | Resp 16 | Ht 65.0 in | Wt 160.8 lb

## 2019-04-07 DIAGNOSIS — F32A Depression, unspecified: Secondary | ICD-10-CM

## 2019-04-07 DIAGNOSIS — F329 Major depressive disorder, single episode, unspecified: Secondary | ICD-10-CM

## 2019-04-07 DIAGNOSIS — G4700 Insomnia, unspecified: Secondary | ICD-10-CM | POA: Diagnosis not present

## 2019-04-07 DIAGNOSIS — L237 Allergic contact dermatitis due to plants, except food: Secondary | ICD-10-CM | POA: Diagnosis not present

## 2019-04-07 MED ORDER — AMITRIPTYLINE HCL 25 MG PO TABS: 25.0000 mg | ORAL_TABLET | Freq: Every day | ORAL | 1 refills | Status: DC

## 2019-04-07 MED ORDER — BETAMETHASONE VALERATE 0.1 % EX OINT: 1.0000 "application " | TOPICAL_OINTMENT | Freq: Two times a day (BID) | CUTANEOUS | 0 refills | Status: DC

## 2019-04-07 NOTE — Progress Notes (Signed)
Subjective:    Patient ID: Tiffany Lyons, female    DOB: 18-Oct-1985, 33 y.o.   MRN: [id number]  HPI  Patient is a 33 year old female who presents today for follow-up.  Insomnia- last visit we discussed her issues with insomnia and various sleep hygiene techniques.  We also gave her a trial of Elavil. Reports that she is feeling much better.   Situational depression-  reports that she is feeling better. Wants to interact more.   Poison Ivy- Pt notes rash on abdomen. She thinks she got it form her dog.  Still itching.      Review of Systems    see hpi  Past Medical History:  Diagnosis Date  . Depression   . Family history of colon cancer   . Gallstones   . Genetic testing 11/25/2016   Test Results: No pathogenic mutations detected.  Genes Analyzed: 43 genes on [facility]'s Common Cancers panel (APC, ATM, AXIN2, BARD1, BMPR1A, BRCA1, BRCA2, BRIP1, CDH1, CDKN2A, CHEK2, DICER1, EPCAM, GREM1, HOXB13, KIT, MEN1, MLH1, MSH2, MSH6, MUTYH, NBN, NF1, PALB2, PDGFRA, PMS2, POLD1, POLE, PTEN, RAD50, RAD51C, RAD51D, SDHA, SDHB, SDHC, SDHD, SMAD4, SMARCA4, STK11, TP53, TSC1, TSC2, VHL).  . History of kidney stones   . Vitamin B12 deficiency      Social History   Socioeconomic History  . Marital status: Married    Spouse name: Not on file  . Number of children: 1  . Years of education: Not on file  . Highest education level: Not on file  Occupational History  . Occupation: custodian, bus driver  Social Needs  . Financial resource strain: Not on file  . Food insecurity    Worry: Not on file    Inability: Not on file  . Transportation needs    Medical: Not on file    Non-medical: Not on file  Tobacco Use  . Smoking status: Former Smoker    Types: Cigarettes    Quit date: 07/31/2013    Years since quitting: 5.6  . Smokeless tobacco: Never Used  . Tobacco comment: Using vapor cigarettes.  Substance and Sexual Activity  . Alcohol use: Yes    Alcohol/week: 8.0 standard drinks   Types: 8 Standard drinks or equivalent per week    Comment: occ.  . Drug use: No  . Sexual activity: Not on file  Lifestyle  . Physical activity    Days per week: Not on file    Minutes per session: Not on file  . Stress: Not on file  Relationships  . Social Herbalist on phone: Not on file    Gets together: Not on file    Attends religious service: Not on file    Active member of club or organization: Not on file    Attends meetings of clubs or organizations: Not on file    Relationship status: Not on file  . Intimate partner violence    Fear of current or ex partner: Not on file    Emotionally abused: Not on file    Physically abused: Not on file    Forced sexual activity: Not on file  Other Topics Concern  . 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 CHOLECYSTECTOMY  10/17/12   lap chole  . COLONOSCOPY  02/2017   will need repeat every 5 yrs per pt.  . WISDOM TOOTH EXTRACTION      Family History  Problem Relation Age of Onset  . Arthritis Mother   . Hemophilia Mother   . Irritable bowel syndrome Mother   . Colon cancer Father        first diagnosed at age 63, symptoms at 7  . Acute myelogenous leukemia Maternal Grandfather   . Diabetes Paternal Grandfather   . Colon cancer Paternal Grandfather        Dx 58s; currently 4  . Heart disease Paternal Grandfather   . Colon cancer Other        pat grandfather's mother  . Cancer Other        several siblings of paternal grandfather; unk. types of cancer    No Known Allergies  Current Outpatient Medications on File Prior to Visit  Medication Sig Dispense Refill  . amitriptyline (ELAVIL) 25 MG tablet Take 1 tablet (25 mg total) by mouth at bedtime. 30 tablet 1  .  Cyanocobalamin (B-12) 500 MCG TABS Take 1 tablet by mouth daily. 30 tablet   . loratadine (CLARITIN) 10 MG tablet Take 1 tablet (10 mg total) by mouth daily. 90 tablet 1  . Multiple Vitamins-Minerals (MULTIVITAMIN WITH MINERALS) tablet Take 1 tablet by mouth daily.    . norethindrone-ethinyl estradiol-iron (MICROGESTIN FE,GILDESS FE,LOESTRIN FE) 1.5-30 MG-MCG tablet TAKE 1 TABLET BY MOUTH ONCE DAILY 28 tablet 5   Current Facility-Administered Medications on File Prior to Visit  Medication Dose Route Frequency Provider Last Rate Last Dose  . 0.9 %  sodium chloride infusion  500 mL Intravenous Continuous Danis, Estill Cotta III, MD        BP 110/75 (BP Location: Right Arm, Patient Position: Sitting, Cuff Size: Small)   Pulse 97   Temp 98.7 F (37.1 C) (Oral)   Resp 16   Ht '5\' 5"'  (1.651 m)   Wt 160 lb 12.8 oz (72.9 kg)   SpO2 100%   BMI 26.76 kg/m    Objective:   Physical Exam Constitutional:      Appearance: She is well-developed.  Neck:     Musculoskeletal: Neck supple.     Thyroid: No thyromegaly.  Cardiovascular:     Rate and Rhythm: Normal rate and regular rhythm.     Heart sounds: Normal heart sounds. No murmur.  Pulmonary:     Effort: Pulmonary effort is normal. No respiratory distress.     Breath sounds: Normal breath sounds. No wheezing.  Skin:    General: Skin is warm and dry.     Comments: Skin rash noted on right side of abdomen   Neurological:     Mental Status: She is alert and oriented to person, place, and time.  Psychiatric:        Behavior: Behavior normal.        Thought Content: Thought content normal.        Judgment: Judgment normal.           Assessment & Plan:   Insomnia- stable/improved. Continue elavil.  Depression- stable on elavil, continue same.  Poison Ivy- rx sent for betamethasone cream.

## 2019-04-11 ENCOUNTER — Other Ambulatory Visit: Payer: Self-pay | Admitting: Family

## 2019-06-25 ENCOUNTER — Other Ambulatory Visit: Payer: Self-pay | Admitting: Family

## 2019-08-02 ENCOUNTER — Encounter: Payer: BC Managed Care – PPO | Admitting: Family

## 2019-08-07 ENCOUNTER — Encounter: Payer: BC Managed Care – PPO | Admitting: Family

## 2019-08-15 ENCOUNTER — Encounter: Payer: Self-pay | Admitting: Family

## 2019-08-15 ENCOUNTER — Other Ambulatory Visit: Payer: Self-pay

## 2019-08-15 ENCOUNTER — Ambulatory Visit (INDEPENDENT_AMBULATORY_CARE_PROVIDER_SITE_OTHER): Payer: BC Managed Care – PPO | Admitting: Family

## 2019-08-15 VITALS — BP 124/85 | HR 96 | Temp 97.2°F | Resp 16 | Ht 65.0 in | Wt 162.8 lb

## 2019-08-15 DIAGNOSIS — Z0000 Encounter for general adult medical examination without abnormal findings: Secondary | ICD-10-CM

## 2019-08-15 DIAGNOSIS — G4700 Insomnia, unspecified: Secondary | ICD-10-CM | POA: Diagnosis not present

## 2019-08-15 DIAGNOSIS — Z23 Encounter for immunization: Secondary | ICD-10-CM

## 2019-08-15 LAB — HEPATIC FUNCTION PANEL
ALT: 16 U/L (ref 0–35)
AST: 20 U/L (ref 0–37)
Albumin: 4.3 g/dL (ref 3.5–5.2)
Alkaline Phosphatase: 68 U/L (ref 39–117)
Bilirubin, Direct: 0.1 mg/dL (ref 0.0–0.3)
Total Bilirubin: 0.3 mg/dL (ref 0.2–1.2)
Total Protein: 7.1 g/dL (ref 6.0–8.3)

## 2019-08-15 LAB — BASIC METABOLIC PANEL
BUN: 7 mg/dL (ref 6–23)
CO2: 25 mEq/L (ref 19–32)
Calcium: 9.3 mg/dL (ref 8.4–10.5)
Chloride: 102 mEq/L (ref 96–112)
Creatinine, Ser: 0.7 mg/dL (ref 0.40–1.20)
GFR: 96.06 mL/min (ref >60.00)
Glucose, Bld: 98 mg/dL (ref 70–99)
Potassium: 4.1 mEq/L (ref 3.5–5.1)
Sodium: 136 mEq/L (ref 135–145)

## 2019-08-15 LAB — CBC WITH DIFFERENTIAL/PLATELET
Basophils Absolute: 0 10*3/uL (ref 0.0–0.1)
Basophils Relative: 0.7 % (ref 0.0–3.0)
Eosinophils Absolute: 0.1 10*3/uL (ref 0.0–0.7)
Eosinophils Relative: 1.3 % (ref 0.0–5.0)
HCT: 39 % (ref 36.0–46.0)
Hemoglobin: 13 g/dL (ref 12.0–15.0)
Lymphocytes Relative: 20.8 % (ref 12.0–46.0)
Lymphs Abs: 1.5 10*3/uL (ref 0.7–4.0)
MCHC: 33.3 g/dL (ref 30.0–36.0)
MCV: 92.6 fl (ref 78.0–100.0)
Monocytes Absolute: 0.5 10*3/uL (ref 0.1–1.0)
Monocytes Relative: 7.5 % (ref 3.0–12.0)
Neutro Abs: 4.9 10*3/uL (ref 1.4–7.7)
Neutrophils Relative %: 69.7 % (ref 43.0–77.0)
Platelets: 324 10*3/uL (ref 150.0–400.0)
RBC: 4.21 Mil/uL (ref 3.87–5.11)
RDW: 13.3 % (ref 11.5–15.5)
WBC: 7.1 10*3/uL (ref 4.0–10.5)

## 2019-08-15 LAB — LIPID PANEL
Cholesterol: 179 mg/dL (ref 0–200)
HDL: 65 mg/dL (ref >39.00)
LDL Cholesterol: 86 mg/dL (ref 0–99)
NonHDL: 114.01
Total CHOL/HDL Ratio: 3
Triglycerides: 139 mg/dL (ref 0.0–149.0)
VLDL: 27.8 mg/dL (ref 0.0–40.0)

## 2019-08-15 LAB — TSH: TSH: 1.52 u[IU]/mL (ref 0.35–4.50)

## 2019-08-15 NOTE — Patient Instructions (Signed)
Please complete lab work prior to leaving.    Preventive Care 21-33 Years Old, Female Preventive care refers to visits with your health care provider and lifestyle choices that can promote health and wellness. This includes:  A yearly physical exam. This may also be called an annual well check.  Regular dental visits and eye exams.  Immunizations.  Screening for certain conditions.  Healthy lifestyle choices, such as eating a healthy diet, getting regular exercise, not using drugs or products that contain nicotine and tobacco, and limiting alcohol use. What can I expect for my preventive care visit? Physical exam Your health care provider will check your:  Height and weight. This may be used to calculate body mass index (BMI), which tells if you are at a healthy weight.  Heart rate and blood pressure.  Skin for abnormal spots. Counseling Your health care provider may ask you questions about your:  Alcohol, tobacco, and drug use.  Emotional well-being.  Home and relationship well-being.  Sexual activity.  Eating habits.  Work and work environment.  Method of birth control.  Menstrual cycle.  Pregnancy history. What immunizations do I need?  Influenza (flu) vaccine  This is recommended every year. Tetanus, diphtheria, and pertussis (Tdap) vaccine  You may need a Td booster every 10 years. Varicella (chickenpox) vaccine  You may need this if you have not been vaccinated. Human papillomavirus (HPV) vaccine  If recommended by your health care provider, you may need three doses over 6 months. Measles, mumps, and rubella (MMR) vaccine  You may need at least one dose of MMR. You may also need a second dose. Meningococcal conjugate (MenACWY) vaccine  One dose is recommended if you are age 19-21 years and a first-year college student living in a residence hall, or if you have one of several medical conditions. You may also need additional booster  doses. Pneumococcal conjugate (PCV13) vaccine  You may need this if you have certain conditions and were not previously vaccinated. Pneumococcal polysaccharide (PPSV23) vaccine  You may need one or two doses if you smoke cigarettes or if you have certain conditions. Hepatitis A vaccine  You may need this if you have certain conditions or if you travel or work in places where you may be exposed to hepatitis A. Hepatitis B vaccine  You may need this if you have certain conditions or if you travel or work in places where you may be exposed to hepatitis B. Haemophilus influenzae type b (Hib) vaccine  You may need this if you have certain conditions. You may receive vaccines as individual doses or as more than one vaccine together in one shot (combination vaccines). Talk with your health care provider about the risks and benefits of combination vaccines. What tests do I need?  Blood tests  Lipid and cholesterol levels. These may be checked every 5 years starting at age 20.  Hepatitis C test.  Hepatitis B test. Screening  Diabetes screening. This is done by checking your blood sugar (glucose) after you have not eaten for a while (fasting).  Sexually transmitted disease (STD) testing.  BRCA-related cancer screening. This may be done if you have a family history of breast, ovarian, tubal, or peritoneal cancers.  Pelvic exam and Pap test. This may be done every 3 years starting at age 21. Starting at age 30, this may be done every 5 years if you have a Pap test in combination with an HPV test. Talk with your health care provider about your test results,   treatment options, and if necessary, the need for more tests. Follow these instructions at home: Eating and drinking   Eat a diet that includes fresh fruits and vegetables, whole grains, lean protein, and low-fat dairy.  Take vitamin and mineral supplements as recommended by your health care provider.  Do not drink alcohol  if: ? Your health care provider tells you not to drink. ? You are pregnant, may be pregnant, or are planning to become pregnant.  If you drink alcohol: ? Limit how much you have to 0-1 drink a day. ? Be aware of how much alcohol is in your drink. In the [place] one drink equals one 12 oz bottle of beer (355 mL), one 5 oz glass of wine (148 mL), or one 1 oz glass of hard liquor (44 mL). Lifestyle  Take daily care of your teeth and gums.  Stay active. Exercise for at least 30 minutes on 5 or more days each week.  Do not use any products that contain nicotine or tobacco, such as cigarettes, e-cigarettes, and chewing tobacco. If you need help quitting, ask your health care provider.  If you are sexually active, practice safe sex. Use a condom or other form of birth control (contraception) in order to prevent pregnancy and STIs (sexually transmitted infections). If you plan to become pregnant, see your health care provider for a preconception visit. What's next?  Visit your health care provider once a year for a well check visit.  Ask your health care provider how often you should have your eyes and teeth checked.  Stay up to date on all vaccines. This information is not intended to replace advice given to you by your health care provider. Make sure you discuss any questions you have with your health care provider. Document Released: 11/24/2001 Document Revised: 06/09/2018 Document Reviewed: 06/09/2018 Elsevier Patient Education  2020 [employer]  

## 2019-08-15 NOTE — Progress Notes (Signed)
Subjective:    Patient ID: Tiffany Lyons, female    DOB: 1985/12/07, 33 y.o.   MRN: [id number]  HPI  Patient presents today for complete physical.  Immunizations: Tdap, flu shot today Diet: fair diet Exercise: no Pap Smear: 04/30/17 Colo: age 40, on Q5 yr plan. Reports normal genetic screening. Dad had colon CA   Insomnia- maintained on elavil. Reports improved sleep and improved mood on elavil.   Review of Systems  Constitutional: Negative for unexpected weight change.  HENT: Negative for hearing loss and rhinorrhea.   Eyes: Negative for visual disturbance.  Respiratory: Negative for cough and shortness of breath.   Cardiovascular: Negative for chest pain.  Gastrointestinal: Negative for constipation and diarrhea.  Genitourinary: Negative for dysuria, frequency and menstrual problem.  Musculoskeletal: Negative for arthralgias and myalgias.  Skin: Negative for rash.  Neurological: Negative for headaches.  Hematological: Negative for adenopathy.  Psychiatric/Behavioral:       Denies depression/anxiety   Past Medical History:  Diagnosis Date  . Depression   . Family history of colon cancer   . Gallstones   . Genetic testing 11/25/2016   Test Results: No pathogenic mutations detected.  Genes Analyzed: 43 genes on [facility] Common Cancers panel (APC, ATM, AXIN2, BARD1, BMPR1A, BRCA1, BRCA2, BRIP1, CDH1, CDKN2A, CHEK2, DICER1, EPCAM, GREM1, HOXB13, KIT, MEN1, MLH1, MSH2, MSH6, MUTYH, NBN, NF1, PALB2, PDGFRA, PMS2, POLD1, POLE, PTEN, RAD50, RAD51C, RAD51D, SDHA, SDHB, SDHC, SDHD, SMAD4, SMARCA4, STK11, TP53, TSC1, TSC2, VHL).  . History of kidney stones   . Vitamin B12 deficiency      Social History   Socioeconomic History  . Marital status: Married    Spouse name: Not on file  . Number of children: 1  . Years of education: Not on file  . Highest education level: Not on file  Occupational History  . Occupation: custodian, bus driver  Social Needs  . Financial resource  strain: Not on file  . Food insecurity    Worry: Not on file    Inability: Not on file  . Transportation needs    Medical: Not on file    Non-medical: Not on file  Tobacco Use  . Smoking status: Former Smoker    Types: Cigarettes    Quit date: 07/31/2013    Years since quitting: 6.0  . Smokeless tobacco: Never Used  . Tobacco comment: Using vapor cigarettes.  Substance and Sexual Activity  . Alcohol use: Yes    Alcohol/week: 8.0 standard drinks    Types: 8 Standard drinks or equivalent per week    Comment: occ.  . Drug use: No  . Sexual activity: Not on file  Lifestyle  . Physical activity    Days per week: Not on file    Minutes per session: Not on file  . Stress: Not on file  Relationships  . Social Herbalist on phone: Not on file    Gets together: Not on file    Attends religious service: Not on file    Active member of club or organization: Not on file    Attends meetings of clubs or organizations: Not on file    Relationship status: Not on file  . Intimate partner violence    Fear of current or ex partner: Not on file    Emotionally abused: Not on file    Physically abused: Not on file    Forced sexual activity: Not on file  Other Topics Concern  . 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 CHOLECYSTECTOMY  10/17/12   lap chole  . COLONOSCOPY  02/2017   will need repeat every 5 yrs per pt.  . WISDOM TOOTH EXTRACTION      Family History  Problem Relation Age of Onset  . Arthritis Mother   . Hemophilia Mother   . Irritable bowel syndrome Mother   . Colon cancer Father        first diagnosed at age 76, symptoms at 84  . Acute myelogenous leukemia Maternal Grandfather   . Diabetes Paternal Grandfather   . Colon  cancer Paternal Grandfather        Dx 66s; currently 12  . Heart disease Paternal Grandfather   . Colon cancer Other        pat grandfather's mother  . Cancer Other        several siblings of paternal grandfather; unk. types of cancer    No Known Allergies  Current Outpatient Medications on File Prior to Visit  Medication Sig Dispense Refill  . amitriptyline (ELAVIL) 25 MG tablet Take 1 tablet (25 mg total) by mouth at bedtime. 90 tablet 1  . betamethasone valerate ointment (VALISONE) 0.1 % Apply 1 application topically 2 (two) times daily. 30 g 0  . Cyanocobalamin (B-12) 500 MCG TABS Take 1 tablet by mouth daily. 30 tablet   . EQ ALLERGY RELIEF 10 MG tablet Take 1 tablet by mouth once daily 30 tablet 0  . Multiple Vitamins-Minerals (MULTIVITAMIN WITH MINERALS) tablet Take 1 tablet by mouth daily.    . norethindrone-ethinyl estradiol-iron (JUNEL FE 1.5/30) 1.5-30 MG-MCG tablet Take 1 tablet by mouth daily. 1 Package 11   Current Facility-Administered Medications on File Prior to Visit  Medication Dose Route Frequency Provider Last Rate Last Dose  . 0.9 %  sodium chloride infusion  500 mL Intravenous Continuous Danis, Estill Cotta III, MD        BP 124/85 (BP Location: Right Arm, Patient Position: Sitting, Cuff Size: Small)   Pulse 96   Temp (!) 97.2 F (36.2 C) (Oral)   Resp 16   Ht '5\' 5"'  (1.651 m)   Wt 162 lb 12.8 oz (73.8 kg)   SpO2 100%   BMI 27.09 kg/m       Objective:   Physical Exam  Physical Exam  Constitutional: She is oriented to person, place, and time. She appears well-developed and well-nourished. No distress.  HENT:  Head: Normocephalic and atraumatic.  Right Ear: Tympanic membrane and ear canal normal.  Left Ear: Tympanic membrane and ear canal normal.  Mouth/Throat: Oropharynx is clear and moist.  Eyes: Pupils are equal, round, and reactive to light. No scleral icterus.  Neck: Normal range of motion. No thyromegaly present.  Cardiovascular: Normal rate and  regular rhythm.   No murmur heard. Pulmonary/Chest: Effort normal and breath sounds normal. No respiratory distress. He has no wheezes. She has no rales. She exhibits no tenderness.  Abdominal: Soft. Bowel sounds are normal. She exhibits no distension and no mass. There is no tenderness. There is no rebound and no guarding.  Musculoskeletal: She exhibits no edema.  Lymphadenopathy:  She has no cervical adenopathy.  Neurological: She is alert and oriented to person, place, and time. She has normal patellar reflexes. She exhibits normal muscle tone. Coordination normal.  Skin: Skin is warm and dry.  Psychiatric: She has a normal mood and affect. Her behavior is normal. Judgment and thought content normal.  Breasts: Examined lying (bilateral nipple piercing) Right: Without masses, retractions, discharge or axillary adenopathy.  Left: Without masses, retractions, discharge or axillary adenopathy.           Assessment & Plan:   Preventative care- Discussed need to increase exercise with goal of 30 minutes 5 days a week. Pap and colo up to date. Obtain routine labs.   Insomnia- stable on elavil, continue same.     Assessment & Plan:

## 2019-11-08 ENCOUNTER — Encounter: Payer: Self-pay | Admitting: Family

## 2019-11-14 ENCOUNTER — Other Ambulatory Visit: Payer: Self-pay

## 2019-11-14 ENCOUNTER — Ambulatory Visit: Payer: BC Managed Care – PPO | Admitting: Family

## 2019-11-14 ENCOUNTER — Encounter: Payer: Self-pay | Admitting: Family

## 2019-11-14 VITALS — BP 128/86 | HR 87 | Temp 97.4°F | Resp 16 | Ht 65.0 in | Wt 167.0 lb

## 2019-11-14 DIAGNOSIS — R2231 Localized swelling, mass and lump, right upper limb: Secondary | ICD-10-CM

## 2019-11-14 DIAGNOSIS — G5601 Carpal tunnel syndrome, right upper limb: Secondary | ICD-10-CM | POA: Diagnosis not present

## 2019-11-14 MED ORDER — MELOXICAM 7.5 MG PO TABS: 7.5000 mg | ORAL_TABLET | Freq: Every day | ORAL | 0 refills | Status: DC

## 2019-11-14 NOTE — Progress Notes (Signed)
Subjective:    Patient ID: Tiffany Lyons, female    DOB: [birth year], 34 y.o.   MRN: [id number]  HPI  Patient is a 34 yr old female who presents today with chief complaint of mass on right arm. Reports that she first noticed 10 days ago. Initially the mass was non-tender but recently has become tender. She also reports that sometimes pain radiates into the palm of her hand and her 3rd/4th/5th fingers "get tingly." She is right hand dominant.    Review of Systems See HPI  Past Medical History:  Diagnosis Date  . Depression   . Family history of colon cancer   . Gallstones   . Genetic testing 11/25/2016   Test Results: No pathogenic mutations detected.  Genes Analyzed: 43 genes on [facility] Common Cancers panel (APC, ATM, AXIN2, BARD1, BMPR1A, BRCA1, BRCA2, BRIP1, CDH1, CDKN2A, CHEK2, DICER1, EPCAM, GREM1, HOXB13, KIT, MEN1, MLH1, MSH2, MSH6, MUTYH, NBN, NF1, PALB2, PDGFRA, PMS2, POLD1, POLE, PTEN, RAD50, RAD51C, RAD51D, SDHA, SDHB, SDHC, SDHD, SMAD4, SMARCA4, STK11, TP53, TSC1, TSC2, VHL).  . History of kidney stones   . Vitamin B12 deficiency      Social History   Socioeconomic History  . Marital status: Married    Spouse name: Not on file  . Number of children: 1  . Years of education: Not on file  . Highest education level: Not on file  Occupational History  . Occupation: custodian, bus driver  Tobacco Use  . Smoking status: Former Smoker    Types: Cigarettes    Quit date: 07/31/2013    Years since quitting: 6.2  . Smokeless tobacco: Never Used  . Tobacco comment: Using vapor cigarettes.  Substance and Sexual Activity  . Alcohol use: Yes    Alcohol/week: 8.0 standard drinks    Types: 8 Standard drinks or equivalent per week    Comment: occ.  . Drug use: No  . Sexual activity: Not on file  Other Topics Concern  . 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 Difficulty of Paying Living Expenses: Not on file  Food Insecurity:   . Worried About Charity fundraiser in the Last Year: Not on file  . Ran Out of Food in the Last Year: Not on file  Transportation Needs:   . Lack of Transportation (Medical): Not on file  . Lack of Transportation (Non-Medical): Not on file  Physical Activity:   . Days of Exercise per Week: Not on file  . Minutes of Exercise per Session: Not on file  Stress:   . Feeling of Stress : Not on file  Social Connections:   . Frequency of Communication with Friends and Family: Not on file  . Frequency of Social Gatherings with Friends and Family: Not on file  . Attends Religious Services: Not on file  . Active Member of Clubs or Organizations: Not on file  . Attends Archivist Meetings: Not on file  . Marital Status: Not on file  Intimate Partner Violence:   . Fear of Current or Ex-Partner: Not on file  . Emotionally Abused: Not on file  .  Physically Abused: Not on file  . Sexually Abused: Not on file    Past Surgical History:  Procedure Laterality Date  . CHOLECYSTECTOMY  10/17/12   lap chole  . COLONOSCOPY  02/2017   will need repeat every 5 yrs per pt.  . WISDOM TOOTH EXTRACTION      Family History  Problem Relation Age of Onset  . Arthritis Mother   . Hemophilia Mother   . Irritable bowel syndrome Mother   . Colon cancer Father        first diagnosed at age 62, symptoms at 75  . Acute myelogenous leukemia Maternal Grandfather   . Diabetes Paternal Grandfather   . Colon cancer Paternal Grandfather        Dx 47s; currently 9  . Heart disease Paternal Grandfather   . Colon cancer Other        pat grandfather's mother  . Cancer Other        several siblings of paternal grandfather; unk. types of cancer    No Known Allergies  Current Outpatient Medications  on File Prior to Visit  Medication Sig Dispense Refill  . amitriptyline (ELAVIL) 25 MG tablet Take 1 tablet (25 mg total) by mouth at bedtime. 90 tablet 1  . betamethasone valerate ointment (VALISONE) 0.1 % Apply 1 application topically 2 (two) times daily. 30 g 0  . Cyanocobalamin (B-12) 500 MCG TABS Take 1 tablet by mouth daily. 30 tablet   . EQ ALLERGY RELIEF 10 MG tablet Take 1 tablet by mouth once daily 30 tablet 0  . Multiple Vitamins-Minerals (MULTIVITAMIN WITH MINERALS) tablet Take 1 tablet by mouth daily.    . norethindrone-ethinyl estradiol-iron (JUNEL FE 1.5/30) 1.5-30 MG-MCG tablet Take 1 tablet by mouth daily. 1 Package 11   Current Facility-Administered Medications on File Prior to Visit  Medication Dose Route Frequency Provider Last Rate Last Admin  . 0.9 %  sodium chloride infusion  500 mL Intravenous Continuous Danis, Estill Cotta III, MD        BP 128/86 (BP Location: Right Arm, Patient Position: Sitting, Cuff Size: Small)   Pulse 87   Temp (!) 97.4 F (36.3 C) (Temporal)   Resp 16   Ht '5\' 5"'$  (1.651 m)   Wt 167 lb (75.8 kg)   SpO2 100%   BMI 27.79 kg/m       Objective:   Physical Exam Constitutional:      Appearance: She is well-developed.  Neck:     Thyroid: No thyromegaly.  Musculoskeletal:     Cervical back: Neck supple.     Comments: Firm marble sized semi-mobile mass noted right lateral forearm, mildly tender  Skin:    General: Skin is warm and dry.  Neurological:     Mental Status: She is alert and oriented to person, place, and time.     Comments: + tinels right Neg tinels left  Psychiatric:        Behavior: Behavior normal.        Thought Content: Thought content normal.        Judgment: Judgment normal.           Assessment & Plan:  Carpal Tunnel syndrome right- advised pt on trial of meloxicam and right wrist splint to be worn while sleeping and as able during the day.   Arm Mass- suspect cyst, will obtain localizes ultrasound for further  evaluation.   This visit occurred during the SARS-CoV-2 public health emergency.  Safety protocols were in place,  including screening questions prior to the visit, additional usage of staff PPE, and extensive cleaning of exam room while observing appropriate contact time as indicated for disinfecting solutions.

## 2019-11-14 NOTE — Patient Instructions (Addendum)
Begin meloxicam once daily as needed for carpal tunnel.  Purchase an OTC wrist splint to wear while sleeping and as able throughout the day. You should be contacted about scheduling your ultrasound.

## 2019-11-18 ENCOUNTER — Encounter (INDEPENDENT_AMBULATORY_CARE_PROVIDER_SITE_OTHER): Payer: Self-pay

## 2019-11-22 ENCOUNTER — Encounter: Payer: Self-pay | Admitting: Family

## 2019-11-22 ENCOUNTER — Other Ambulatory Visit: Payer: Self-pay

## 2019-11-22 ENCOUNTER — Ambulatory Visit (HOSPITAL_BASED_OUTPATIENT_CLINIC_OR_DEPARTMENT_OTHER)
Admission: RE | Admit: 2019-11-22 | Discharge: 2019-11-22 | Disposition: A | Discharge status: Home / Self Care | Payer: BC Managed Care – PPO | Source: Ambulatory Visit | Attending: Family | Admitting: Family

## 2019-11-22 DIAGNOSIS — R2231 Localized swelling, mass and lump, right upper limb: Secondary | ICD-10-CM

## 2020-03-11 ENCOUNTER — Other Ambulatory Visit: Payer: Self-pay | Admitting: Family

## 2020-04-23 ENCOUNTER — Encounter: Payer: Self-pay | Admitting: Family

## 2020-04-23 MED ORDER — NORETHIN ACE-ETH ESTRAD-FE 1.5-30 MG-MCG PO TABS: 1.0000 | ORAL_TABLET | Freq: Every day | ORAL | 6 refills | Status: DC

## 2020-06-23 IMAGING — CR DG CHEST 2V
2 series · 2 of 2 positions shown · non-contrast
Comparison: Radiographs August 20, 2015.

CLINICAL DATA: Chest pain.

EXAM:
CHEST - 2 VIEW

[w chest pa]
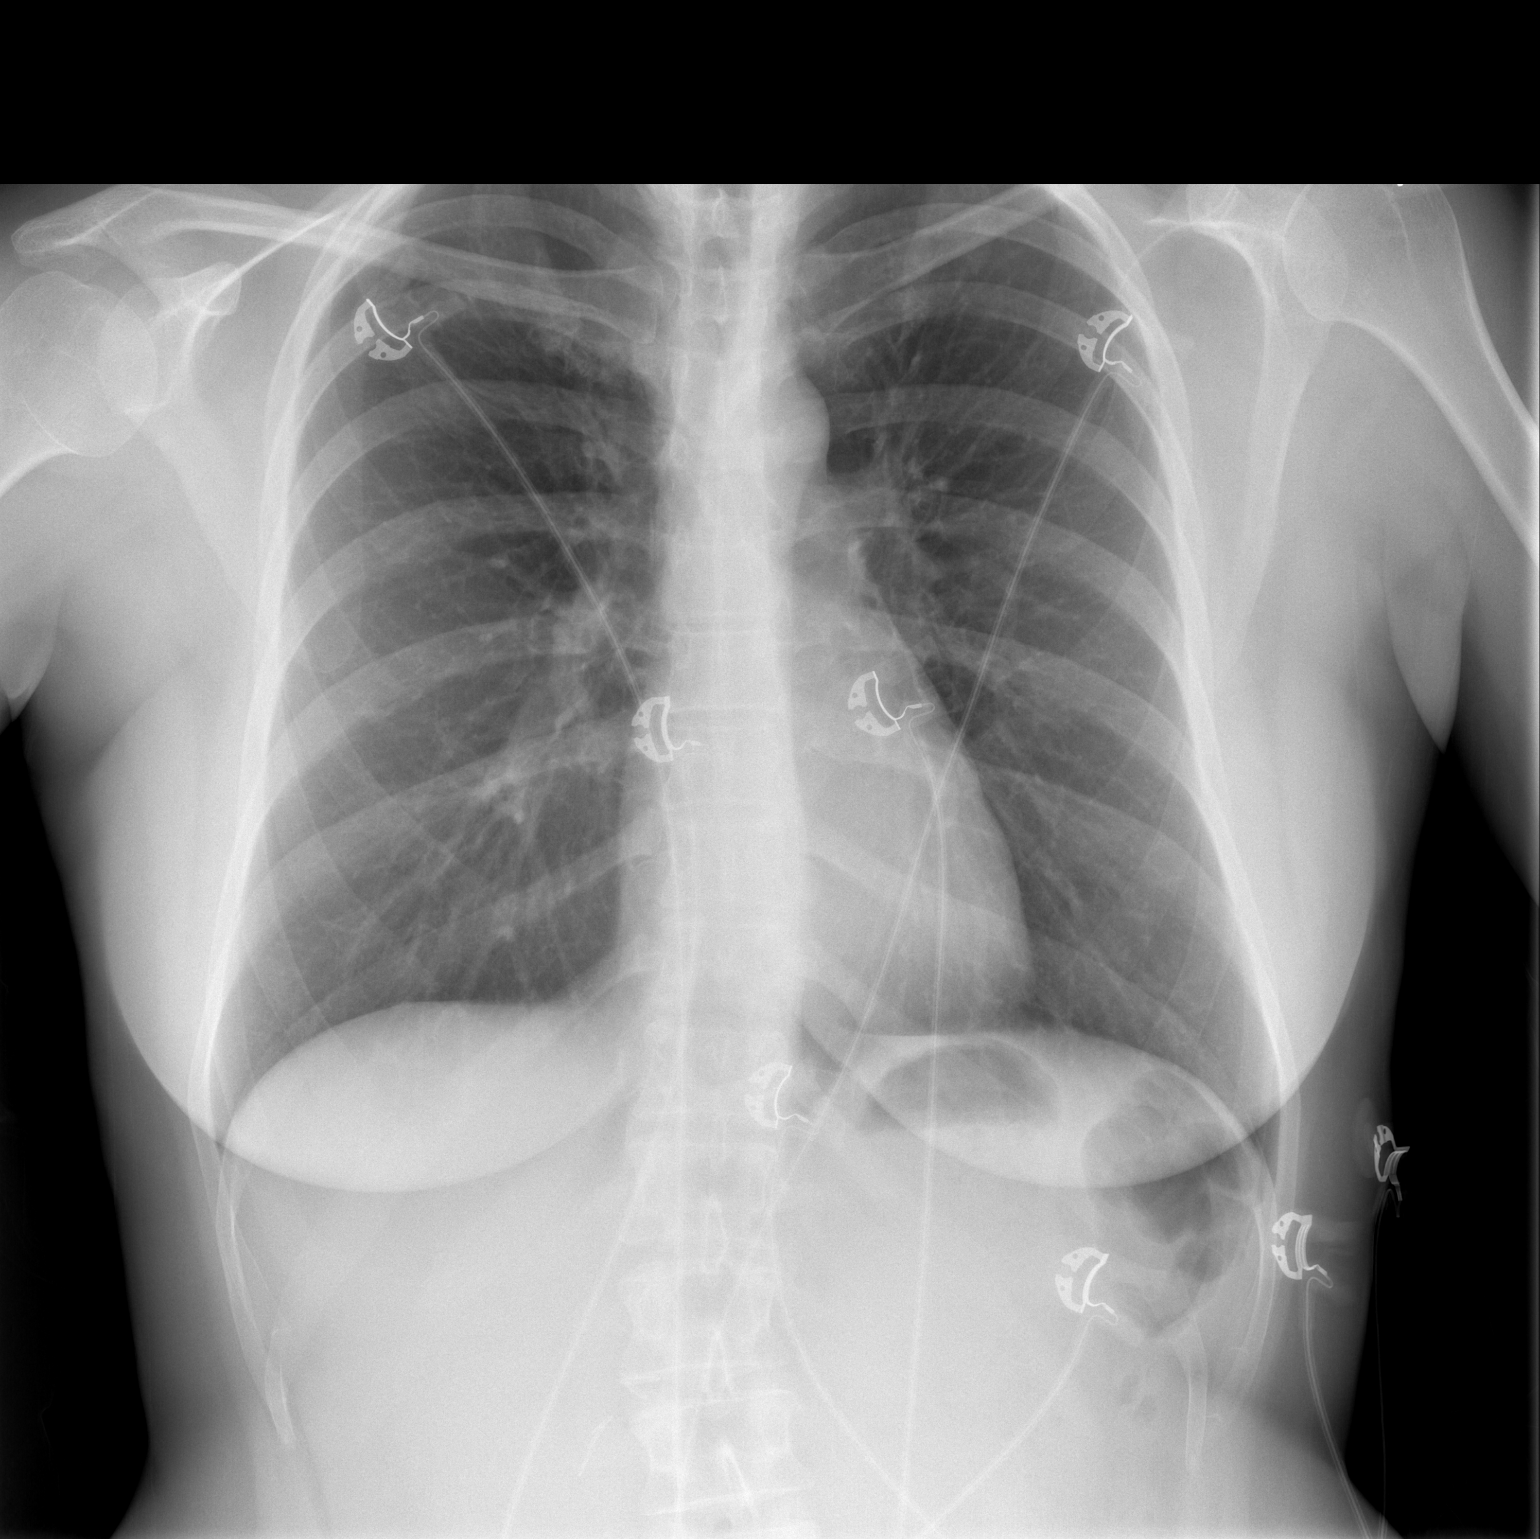

[w chest lat]
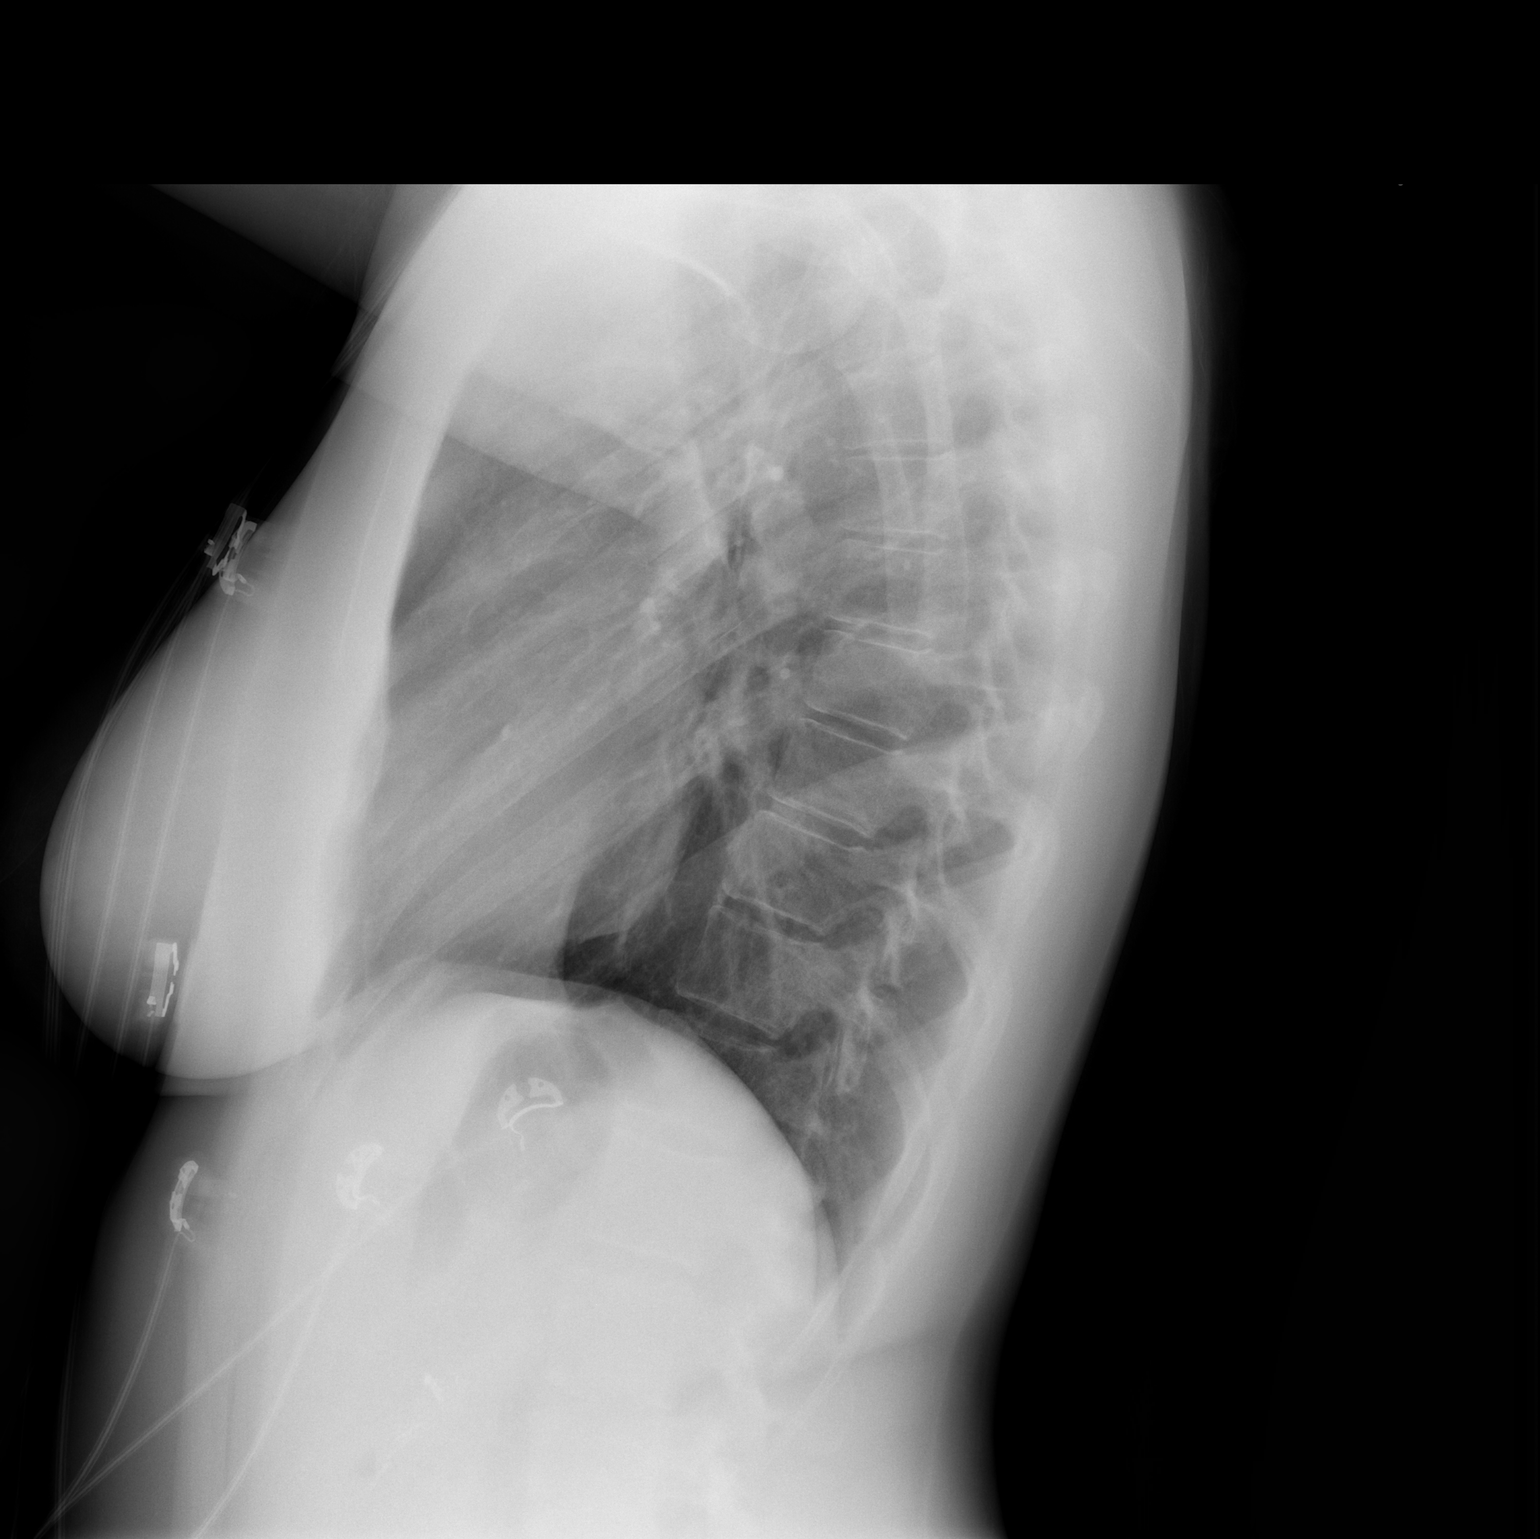

[2 of 2 positions shown; findings below may reference images not displayed]

FINDINGS: The heart size and mediastinal contours are within normal limits.
Both lungs are clear. No pneumothorax or pleural effusion is noted.
The visualized skeletal structures are unremarkable.
IMPRESSION: No active cardiopulmonary disease.

## 2020-09-25 ENCOUNTER — Other Ambulatory Visit: Payer: Self-pay | Admitting: Family

## 2020-09-25 MED ORDER — AMITRIPTYLINE HCL 25 MG PO TABS: 25.0000 mg | ORAL_TABLET | Freq: Every day | ORAL | 0 refills | Status: DC

## 2020-11-07 ENCOUNTER — Other Ambulatory Visit: Payer: Self-pay | Admitting: Family

## 2020-11-15 ENCOUNTER — Other Ambulatory Visit: Payer: Self-pay

## 2020-11-15 ENCOUNTER — Encounter: Payer: Self-pay | Admitting: Family

## 2020-11-15 ENCOUNTER — Ambulatory Visit: Payer: BC Managed Care – PPO | Admitting: Family

## 2020-11-15 VITALS — BP 128/83 | HR 89 | Temp 99.0°F | Resp 16 | Ht 65.0 in | Wt 172.4 lb

## 2020-11-15 DIAGNOSIS — G4700 Insomnia, unspecified: Secondary | ICD-10-CM

## 2020-11-15 MED ORDER — NORETHIN ACE-ETH ESTRAD-FE 1.5-30 MG-MCG PO TABS: 1.0000 | ORAL_TABLET | Freq: Every day | ORAL | 3 refills | Status: DC

## 2020-11-15 MED ORDER — AMITRIPTYLINE HCL 25 MG PO TABS: 25.0000 mg | ORAL_TABLET | Freq: Every day | ORAL | 1 refills | Status: DC

## 2020-11-15 NOTE — Progress Notes (Signed)
Subjective:    Patient ID: Tiffany Lyons, female    DOB: 12/02/1985, 35 y.o.   MRN: [id number]  HPI  Patient is a 35 yr old female who presents today for follow up.   Insomnia- stable on elavil. Reports sleeping well. Stable. Mood.    Review of Systems See HPI  Past Medical History:  Diagnosis Date  . Depression   . Family history of colon cancer   . Gallstones   . Genetic testing 11/25/2016   Test Results: No pathogenic mutations detected.  Genes Analyzed: 43 genes on [facility] Common Cancers panel (APC, ATM, AXIN2, BARD1, BMPR1A, BRCA1, BRCA2, BRIP1, CDH1, CDKN2A, CHEK2, DICER1, EPCAM, GREM1, HOXB13, KIT, MEN1, MLH1, MSH2, MSH6, MUTYH, NBN, NF1, PALB2, PDGFRA, PMS2, POLD1, POLE, PTEN, RAD50, RAD51C, RAD51D, SDHA, SDHB, SDHC, SDHD, SMAD4, SMARCA4, STK11, TP53, TSC1, TSC2, VHL).  . History of kidney stones   . Vitamin B12 deficiency      Social History   Socioeconomic History  . Marital status: Married    Spouse name: Not on file  . Number of children: 1  . Years of education: Not on file  . Highest education level: Not on file  Occupational History  . Occupation: custodian, bus driver  Tobacco Use  . Smoking status: Former Smoker    Types: Cigarettes    Quit date: 07/31/2013    Years since quitting: 7.2  . Smokeless tobacco: Never Used  . Tobacco comment: Using vapor cigarettes.  Vaping Use  . Vaping Use: Some days  Substance and Sexual Activity  . Alcohol use: Yes    Alcohol/week: 8.0 standard drinks    Types: 8 Standard drinks or equivalent per week    Comment: occ.  . Drug use: No  . Sexual activity: Not on file  Other Topics Concern  . 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 CHOLECYSTECTOMY  10/17/12   lap chole  . COLONOSCOPY  02/2017   will need repeat every 5 yrs per pt.  . WISDOM TOOTH EXTRACTION      Family History  Problem Relation Age of Onset  . Arthritis Mother   . Hemophilia Mother   . Irritable bowel syndrome Mother   . Colon cancer Father        first diagnosed at age 37, symptoms at 74  . Acute myelogenous leukemia Maternal Grandfather   . Diabetes Paternal Grandfather   . Colon cancer Paternal Grandfather        Dx 14s; currently 72  . Heart disease Paternal Grandfather   . Colon cancer Other        pat grandfather's mother  . Cancer Other        several siblings of paternal grandfather; unk. types of cancer    No Known Allergies  Current Outpatient Medications on File Prior to Visit  Medication Sig Dispense Refill  . amitriptyline (ELAVIL) 25 MG tablet TAKE 1 TABLET BY MOUTH AT BEDTIME 30 tablet 0  . Cyanocobalamin (B-12) 500 MCG TABS Take 1 tablet by mouth daily. 30 tablet   . EQ ALLERGY RELIEF 10 MG tablet Take 1 tablet by mouth once daily 30 tablet 0  . LARIN FE 1.5/30 1.5-30 MG-MCG tablet Take 1 tablet by mouth once daily 28 tablet 0  . Multiple Vitamins-Minerals (MULTIVITAMIN WITH MINERALS) tablet Take 1 tablet by mouth daily.     Current Facility-Administered Medications on File Prior to Visit  Medication Dose Route Frequency Provider Last Rate Last Admin  . 0.9 %  sodium chloride infusion  500 mL Intravenous Continuous Danis, Estill Cotta III, MD        BP 128/83 (BP Location: Right Arm, Patient Position: Sitting, Cuff Size: Small)   Pulse 89   Temp 99 F (37.2 C) (Oral)   Resp 16   Ht _0  (1.651 m)   Wt 172 lb 6.4 oz (78.2 kg)   SpO2 100%   BMI 28.69 kg/m       Objective:   Physical  Exam Constitutional:      Appearance: She is well-developed and well-nourished.  Neck:     Thyroid: No thyromegaly.  Cardiovascular:     Rate and Rhythm: Normal rate and regular rhythm.     Heart sounds: Normal heart sounds. No murmur heard.   Pulmonary:     Effort: Pulmonary effort is normal. No respiratory distress.     Breath sounds: Normal breath sounds. No wheezing.  Musculoskeletal:     Cervical back: Neck supple.  Skin:    General: Skin is warm and dry.  Neurological:     Mental Status: She is alert and oriented to person, place, and time.  Psychiatric:        Mood and Affect: Mood and affect normal.        Behavior: Behavior normal.        Thought Content: Thought content normal.        Judgment: Judgment normal.           Assessment & Plan:  Insomnia- stable on elavil 49m. Continue same.  This visit occurred during the SARS-CoV-2 public health emergency.  Safety protocols were in place, including screening questions prior to the visit, additional usage of staff PPE, and extensive cleaning of exam room while observing appropriate contact time as indicated for disinfecting solutions.

## 2020-12-16 ENCOUNTER — Encounter: Payer: BC Managed Care – PPO | Admitting: Family

## 2021-01-01 ENCOUNTER — Other Ambulatory Visit: Payer: Self-pay

## 2021-01-01 ENCOUNTER — Ambulatory Visit (INDEPENDENT_AMBULATORY_CARE_PROVIDER_SITE_OTHER): Payer: BC Managed Care – PPO | Admitting: Family

## 2021-01-01 ENCOUNTER — Encounter: Payer: Self-pay | Admitting: Family

## 2021-01-01 ENCOUNTER — Other Ambulatory Visit (HOSPITAL_COMMUNITY)
Admission: RE | Admit: 2021-01-01 | Discharge: 2021-01-01 | Disposition: A | Discharge status: Home / Self Care | Payer: BC Managed Care – PPO | Source: Ambulatory Visit | Attending: Family | Admitting: Family

## 2021-01-01 VITALS — BP 118/77 | HR 81 | Temp 98.8°F | Resp 16 | Ht 65.0 in | Wt 174.0 lb

## 2021-01-01 DIAGNOSIS — Z0000 Encounter for general adult medical examination without abnormal findings: Secondary | ICD-10-CM | POA: Diagnosis not present

## 2021-01-01 DIAGNOSIS — Z8639 Personal history of other endocrine, nutritional and metabolic disease: Secondary | ICD-10-CM | POA: Diagnosis not present

## 2021-01-01 DIAGNOSIS — F32A Depression, unspecified: Secondary | ICD-10-CM | POA: Diagnosis not present

## 2021-01-01 DIAGNOSIS — Z862 Personal history of diseases of the blood and blood-forming organs and certain disorders involving the immune mechanism: Secondary | ICD-10-CM

## 2021-01-01 DIAGNOSIS — R102 Pelvic and perineal pain: Secondary | ICD-10-CM | POA: Diagnosis not present

## 2021-01-01 DIAGNOSIS — Z01419 Encounter for gynecological examination (general) (routine) without abnormal findings: Secondary | ICD-10-CM

## 2021-01-01 DIAGNOSIS — Z1159 Encounter for screening for other viral diseases: Secondary | ICD-10-CM

## 2021-01-01 LAB — CBC WITH DIFFERENTIAL/PLATELET
Basophils Absolute: 0 10*3/uL (ref 0.0–0.1)
Basophils Relative: 0.6 % (ref 0.0–3.0)
Eosinophils Absolute: 0 10*3/uL (ref 0.0–0.7)
Eosinophils Relative: 0.6 % (ref 0.0–5.0)
HCT: 34.5 % — ABNORMAL LOW (ref 36.0–46.0)
Hemoglobin: 11.6 g/dL — ABNORMAL LOW (ref 12.0–15.0)
Lymphocytes Relative: 17.5 % (ref 12.0–46.0)
Lymphs Abs: 1.4 10*3/uL (ref 0.7–4.0)
MCHC: 33.6 g/dL (ref 30.0–36.0)
MCV: 90.7 fl (ref 78.0–100.0)
Monocytes Absolute: 0.5 10*3/uL (ref 0.1–1.0)
Monocytes Relative: 5.9 % (ref 3.0–12.0)
Neutro Abs: 6.2 10*3/uL (ref 1.4–7.7)
Neutrophils Relative %: 75.4 % (ref 43.0–77.0)
Platelets: 304 10*3/uL (ref 150.0–400.0)
RBC: 3.8 Mil/uL — ABNORMAL LOW (ref 3.87–5.11)
RDW: 13.7 % (ref 11.5–15.5)
WBC: 8.2 10*3/uL (ref 4.0–10.5)

## 2021-01-01 LAB — COMPREHENSIVE METABOLIC PANEL
ALT: 10 U/L (ref 0–35)
AST: 12 U/L (ref 0–37)
Albumin: 4.3 g/dL (ref 3.5–5.2)
Alkaline Phosphatase: 62 U/L (ref 39–117)
BUN: 8 mg/dL (ref 6–23)
CO2: 24 mEq/L (ref 19–32)
Calcium: 9.4 mg/dL (ref 8.4–10.5)
Chloride: 103 mEq/L (ref 96–112)
Creatinine, Ser: 0.66 mg/dL (ref 0.40–1.20)
GFR: 114.05 mL/min (ref >60.00)
Glucose, Bld: 93 mg/dL (ref 70–99)
Potassium: 3.8 mEq/L (ref 3.5–5.1)
Sodium: 136 mEq/L (ref 135–145)
Total Bilirubin: 0.4 mg/dL (ref 0.2–1.2)
Total Protein: 6.8 g/dL (ref 6.0–8.3)

## 2021-01-01 NOTE — Assessment & Plan Note (Signed)
>>  ASSESSMENT AND PLAN FOR PREVENTATIVE HEALTH CARE WRITTEN ON 01/01/2021 12:34 PM BY O'SULLIVAN, Jobin Montelongo, NP  Encouraged her to continue healthy diet, exercise and weight loss efforts. Recommended that she obtain a covid booster shot.  Pap performed today.  Labs as ordere.

## 2021-01-01 NOTE — Addendum Note (Signed)
Addended by: Jiles Prows on: 01/01/2021 01:28 PM   Modules accepted: Orders

## 2021-01-01 NOTE — Progress Notes (Signed)
Subjective:   By signing my name below, I, [name], attest that this documentation has been prepared under the direction and in the presence of Debbrah Alar. 01/01/2021   Patient ID: Tiffany Lyons, female    DOB: 10-Jul-1986, 35 y.o.   MRN: [id number]  Chief Complaint  Patient presents with  . Annual Exam         HPI Patient is in today for comprehensive physical exam. She reports that she has been having sharp and random pains by the right side of her ovary that started a 1 month ago. Her menstruals usually last for 5 days if she takes her OCP properly. She does have occasional headaches but she relates this to stress from school. She denies any abdominal pain, dysuria, urinary incontinence, or N/VD at this time.  She reports that she is exercising more, and she has also changed her diet. She stopped eating late.  She reports stopping alcohol as well. Her husband and her are trying something new.  Past Medical History:  Diagnosis Date  . Depression   . Family history of colon cancer   . Gallstones   . Genetic testing 11/25/2016   Test Results: No pathogenic mutations detected.  Genes Analyzed: 43 genes on [facility] Common Cancers panel (APC, ATM, AXIN2, BARD1, BMPR1A, BRCA1, BRCA2, BRIP1, CDH1, CDKN2A, CHEK2, DICER1, EPCAM, GREM1, HOXB13, KIT, MEN1, MLH1, MSH2, MSH6, MUTYH, NBN, NF1, PALB2, PDGFRA, PMS2, POLD1, POLE, PTEN, RAD50, RAD51C, RAD51D, SDHA, SDHB, SDHC, SDHD, SMAD4, SMARCA4, STK11, TP53, TSC1, TSC2, VHL).  . History of kidney stones   . Vitamin B12 deficiency     Past Surgical History:  Procedure Laterality Date  . CHOLECYSTECTOMY  10/17/12   lap chole  . COLONOSCOPY  02/2017   will need repeat every 5 yrs per pt.  . WISDOM TOOTH EXTRACTION      Family History  Problem Relation Age of Onset  . Arthritis Mother   . Hemophilia Mother   . Irritable bowel syndrome Mother   . Colon cancer Father        first diagnosed at age 58, symptoms at 13  . Acute  myelogenous leukemia Maternal Grandfather   . Diabetes Paternal Grandfather   . Colon cancer Paternal Grandfather        Dx 20s; currently 59  . Heart disease Paternal Grandfather   . Colon cancer Other        pat grandfather's mother  . Cancer Other        several siblings of paternal grandfather; unk. types of cancer    Social History   Socioeconomic History  . Marital status: Married    Spouse name: Not on file  . Number of children: 1  . Years of education: Not on file  . Highest education level: Not on file  Occupational History  . Occupation: custodian, bus driver  Tobacco Use  . Smoking status: Former Smoker    Types: Cigarettes    Quit date: 07/31/2013    Years since quitting: 7.4  . Smokeless tobacco: Never Used  . Tobacco comment: Using vapor cigarettes.  Vaping Use  . Vaping Use: Some days  Substance and Sexual Activity  . Alcohol use: Yes    Alcohol/week: 8.0 standard drinks    Types: 8 Standard drinks or equivalent per week    Comment: occ.  . Drug use: No  . Sexual activity: Not on file  Other Topics Concern  . 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 amitriptyline (ELAVIL) 25 MG tablet Take 1 tablet (25 mg total) by mouth at bedtime. 90 tablet 1  . Cyanocobalamin (B-12) 500 MCG TABS Take 1 tablet by mouth daily. 30 tablet   . EQ ALLERGY RELIEF 10 MG tablet Take 1 tablet by mouth once daily 30 tablet 0  . Multiple Vitamins-Minerals  (MULTIVITAMIN WITH MINERALS) tablet Take 1 tablet by mouth daily.    . norethindrone-ethinyl estradiol-iron (LARIN FE 1.5/30) 1.5-30 MG-MCG tablet Take 1 tablet by mouth daily. 84 tablet 3   Facility-Administered Medications Prior to Visit  Medication Dose Route Frequency Provider Last Rate Last Admin  . 0.9 %  sodium chloride infusion  500 mL Intravenous Continuous Danis, Estill Cotta III, MD        No Known Allergies  Review of Systems  HENT: Negative for ear pain and hearing loss.   Gastrointestinal: Negative for abdominal pain, blood in stool, constipation, diarrhea and nausea.  Genitourinary: Negative for frequency, hematuria and urgency.       (+)right suprapubic pain  Neurological: Positive for headaches (relates to stress).       Objective:    Physical Exam Constitutional:      General: She is not in acute distress.    Appearance: Normal appearance. She is not ill-appearing.  HENT:     Head: Normocephalic and atraumatic.     Right Ear: External ear normal. There is impacted cerumen.     Left Ear: Tympanic membrane, ear canal and external ear normal. There is no impacted cerumen.  Eyes:     Extraocular Movements: Extraocular movements intact.     Pupils: Pupils are equal, round, and reactive to light.  Neck:     Comments: No JVD, no lymphadenopathy  Cardiovascular:     Rate and Rhythm: Normal rate and regular rhythm.     Pulses: Normal pulses.     Heart sounds: Normal heart sounds. No murmur heard.   Pulmonary:     Effort: Pulmonary effort is normal. No respiratory distress.     Breath sounds: Normal breath sounds. No wheezing, rhonchi or rales.  Chest:  Breasts:     Right: No mass.     Left: Normal. No mass.    Abdominal:     General: Bowel sounds are normal.     Palpations: Abdomen is soft. There is no mass.     Tenderness: There is no abdominal tenderness. There is no guarding or rebound.  Musculoskeletal:     Cervical back: Normal range of motion.      Right lower leg: No edema.     Left lower leg: No edema.     Comments: 5/5 strength in UE and LE  Skin:    General: Skin is warm and dry.  Neurological:     Mental Status: She is alert and  oriented to person, place, and time.  Psychiatric:        Behavior: Behavior normal.     BP 118/77 (BP Location: Right Arm, Patient Position: Sitting, Cuff Size: Small)   Pulse 81   Temp 98.8 F (37.1 C) (Oral)   Resp 16   Ht '5\' 5"'  (1.651 m)   Wt 174 lb (78.9 kg)   SpO2 100%   BMI 28.96 kg/m  Wt Readings from Last 3 Encounters:  01/01/21 174 lb (78.9 kg)  11/15/20 172 lb 6.4 oz (78.2 kg)  11/14/19 167 lb (75.8 kg)    Diabetic Foot Exam - Simple   No data filed    Lab Results  Component Value Date   WBC 7.1 08/15/2019   HGB 13.0 08/15/2019   HCT 39.0 08/15/2019   PLT 324.0 08/15/2019   GLUCOSE 98 08/15/2019   CHOL 179 08/15/2019   TRIG 139.0 08/15/2019   HDL 65.00 08/15/2019   LDLCALC 86 08/15/2019   ALT 16 08/15/2019   AST 20 08/15/2019   NA 136 08/15/2019   K 4.1 08/15/2019   CL 102 08/15/2019   CREATININE 0.70 08/15/2019   BUN 7 08/15/2019   CO2 25 08/15/2019   TSH 1.52 08/15/2019    Lab Results  Component Value Date   TSH 1.52 08/15/2019   Lab Results  Component Value Date   WBC 7.1 08/15/2019   HGB 13.0 08/15/2019   HCT 39.0 08/15/2019   MCV 92.6 08/15/2019   PLT 324.0 08/15/2019   Lab Results  Component Value Date   NA 136 08/15/2019   K 4.1 08/15/2019   CO2 25 08/15/2019   GLUCOSE 98 08/15/2019   BUN 7 08/15/2019   CREATININE 0.70 08/15/2019   BILITOT 0.3 08/15/2019   ALKPHOS 68 08/15/2019   AST 20 08/15/2019   ALT 16 08/15/2019   PROT 7.1 08/15/2019   ALBUMIN 4.3 08/15/2019   CALCIUM 9.3 08/15/2019   ANIONGAP 9 08/20/2018   GFR 96.06 08/15/2019   Lab Results  Component Value Date   CHOL 179 08/15/2019   Lab Results  Component Value Date   HDL 65.00 08/15/2019   Lab Results  Component Value Date   LDLCALC 86 08/15/2019   Lab  Results  Component Value Date   TRIG 139.0 08/15/2019   Lab Results  Component Value Date   CHOLHDL 3 08/15/2019    Assessment & Plan:   Problem List Items Addressed This Visit      Unprioritized   Preventative health care - Primary    Encouraged her to continue healthy diet, exercise and weight loss efforts. Recommended that she obtain a covid booster shot.  Pap performed today.  Labs as ordere.       Pelvic pain    New. Will obtain pelvic US for further evaluation.       Relevant Orders   US PELVIC COMPLETE W TRANSVAGINAL AND TORSION R/O   Depression    Currently stable on elavil 7m. Continue same.        Other Visit Diagnoses    Need for hepatitis C screening test       Relevant Orders   Hepatitis C Antibody   History of anemia       Relevant Orders   CBC w/Diff   History of hyperglycemia       Relevant Orders   Comp Met (CMET)    Colonoscopy: Last completed on 11/09/2016, results were normal, repeat in 5 years   Pap  Smear: Last completed on 04/30/2017,  Bacterial Vaginitis was noted, repeat in 3 years   No orders of the defined types were placed in this encounter.   I, Nance Pear, NP, personally preformed the services described in this documentation.  All medical record entries made by the scribe were at my direction and in my presence.  I have reviewed the chart and discharge instructions (if applicable) and agree that the record reflects my personal performance and is accurate and complete. 01/01/2021  I, Debbrah Alar personally performed the services described in this documentation.  All medical record entries made by the scribe were at my direction and in my presence.  I have reviewed the chart and discharge instructions (if applicable) and agree that the record reflects my personal performance and is accurate and complete.    Debbrah Alar, NP

## 2021-01-01 NOTE — Assessment & Plan Note (Signed)
Currently stable on elavil 25mg . Continue same.

## 2021-01-01 NOTE — Assessment & Plan Note (Signed)
Encouraged her to continue healthy diet, exercise and weight loss efforts. Recommended that she obtain a covid booster shot.  Pap performed today.  Labs as ordere.

## 2021-01-01 NOTE — Patient Instructions (Signed)
Please complete lab work prior to leaving. Keep up the great work with healthy diet, exercise and weight loss.

## 2021-01-01 NOTE — Assessment & Plan Note (Signed)
New. Will obtain pelvic US for further evaluation.

## 2021-01-02 LAB — CYTOLOGY - PAP
Comment: NEGATIVE
Diagnosis: NEGATIVE
High risk HPV: NEGATIVE

## 2021-01-02 LAB — HEPATITIS C ANTIBODY
Hepatitis C Ab: NONREACTIVE
SIGNAL TO CUT-OFF: 0.01 (ref <1.00)

## 2021-01-06 ENCOUNTER — Ambulatory Visit (HOSPITAL_BASED_OUTPATIENT_CLINIC_OR_DEPARTMENT_OTHER)
Admission: RE | Admit: 2021-01-06 | Discharge: 2021-01-06 | Disposition: A | Discharge status: Home / Self Care | Payer: BC Managed Care – PPO | Source: Ambulatory Visit | Attending: Family | Admitting: Family

## 2021-01-06 ENCOUNTER — Other Ambulatory Visit: Payer: Self-pay

## 2021-01-06 DIAGNOSIS — R102 Pelvic and perineal pain: Secondary | ICD-10-CM | POA: Diagnosis not present

## 2021-01-07 ENCOUNTER — Telehealth: Payer: Self-pay | Admitting: Family

## 2021-01-07 NOTE — Telephone Encounter (Signed)
See mychart.  

## 2021-07-04 ENCOUNTER — Ambulatory Visit: Payer: BC Managed Care – PPO | Admitting: Family

## 2021-08-20 ENCOUNTER — Other Ambulatory Visit: Payer: Self-pay | Admitting: Family

## 2021-09-25 IMAGING — US US EXTREM UP *R* LTD
1 series · 14 of 15 positions shown · non-contrast
Comparison: None.

CLINICAL DATA: Palpable nodule right forearm.

EXAM:
ULTRASOUND RIGHT UPPER EXTREMITY LIMITED
TECHNIQUE: Ultrasound examination of the upper extremity soft tissues was
performed in the area of clinical concern.

[Series 1: us extrem up *right* ltd · 15 acquisitions, 14 frames shown]
[im 1/15]
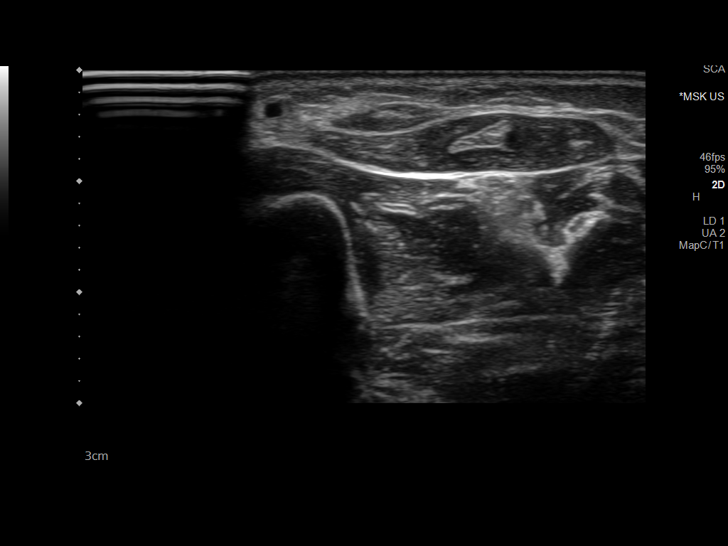
[im 2/15]
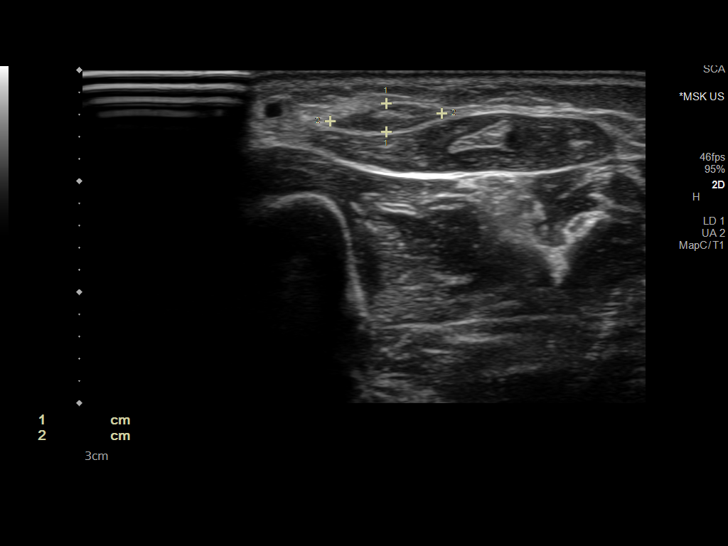
[im 3/15]
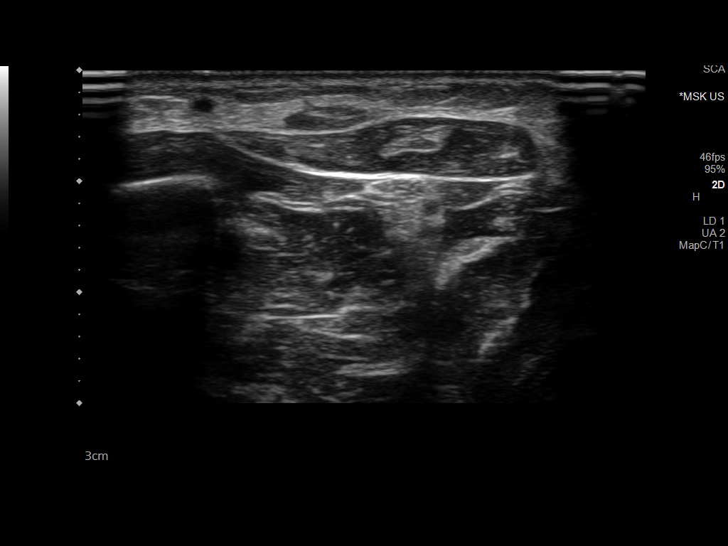
[im 4/15]
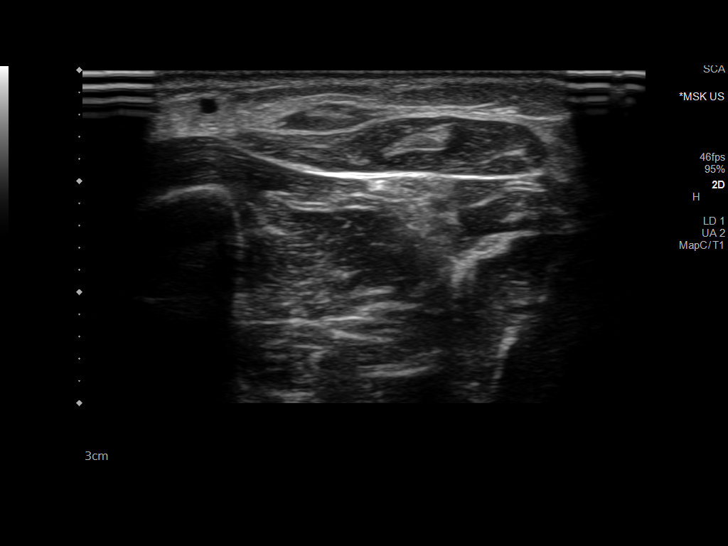
[im 5/15]
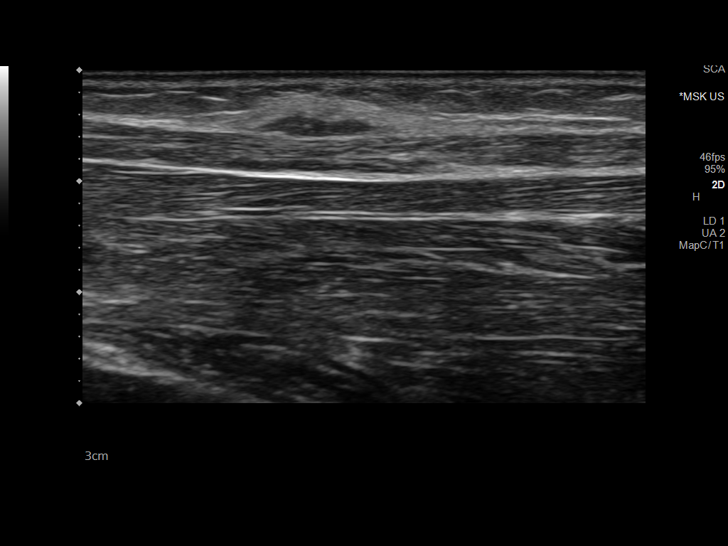
[im 6/15]
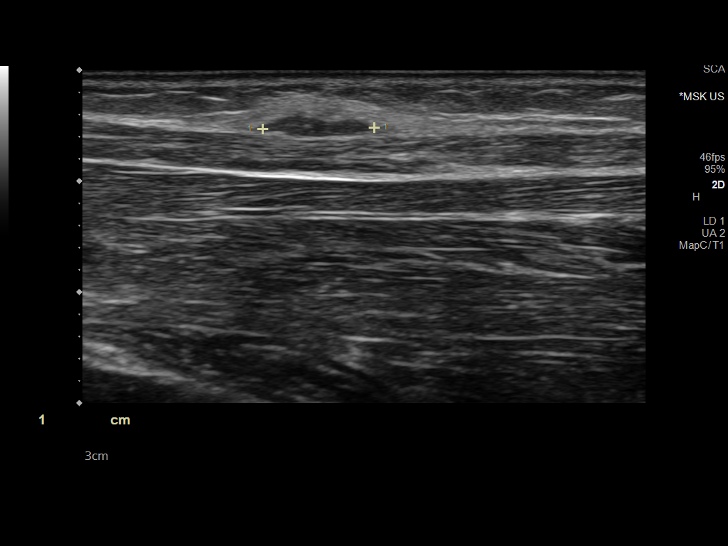
[im 7/15]
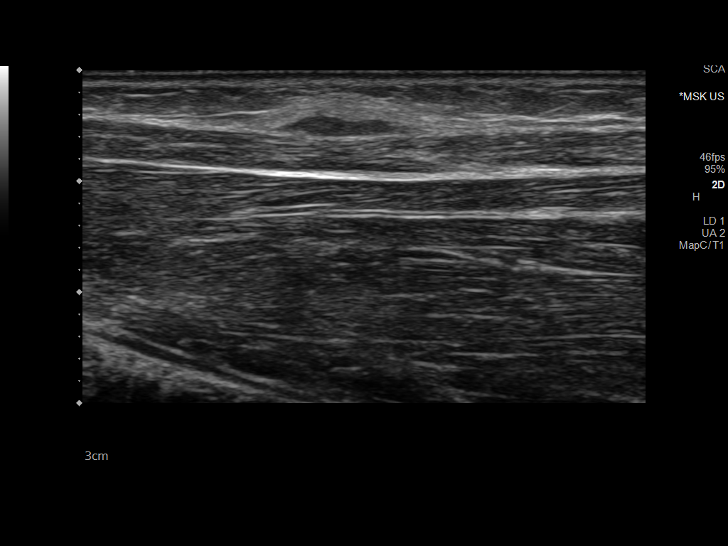
[im 9/15]
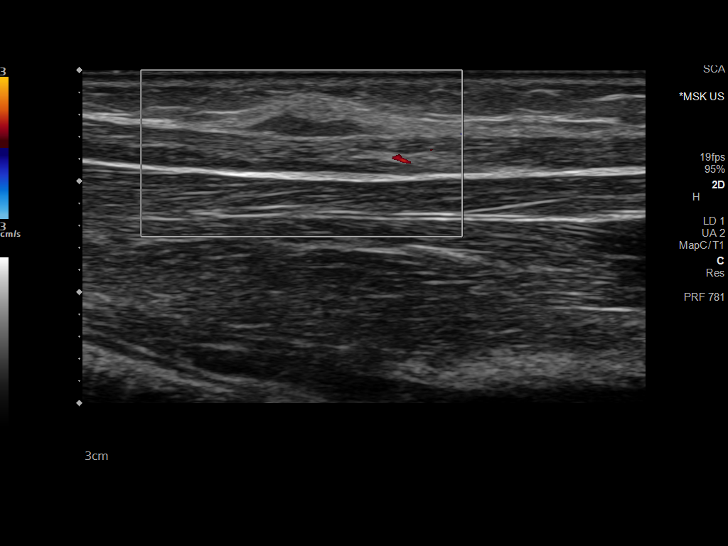
[im 10/15]
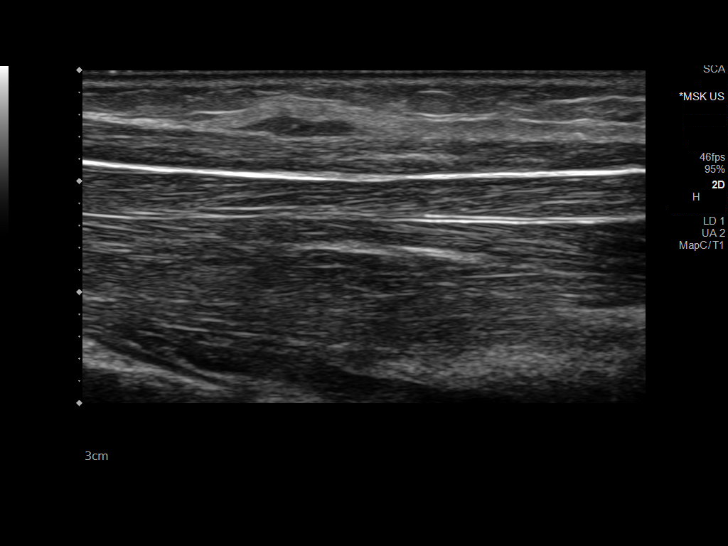
[im 11/15]
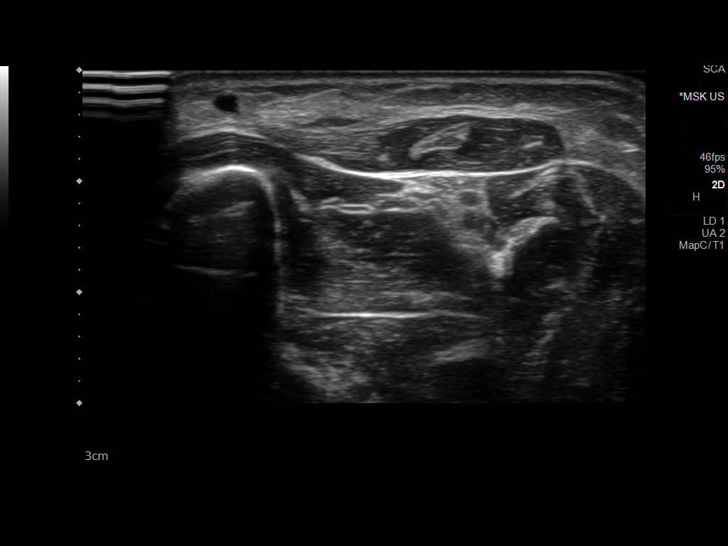
[im 12/15]
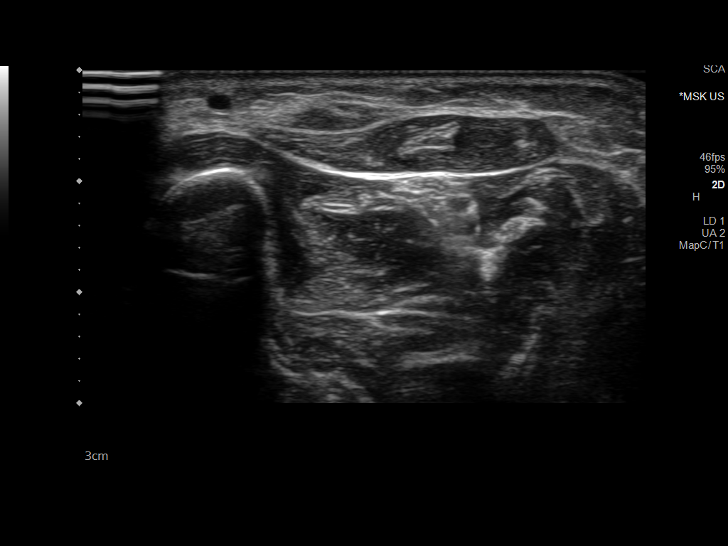
[im 13/15]
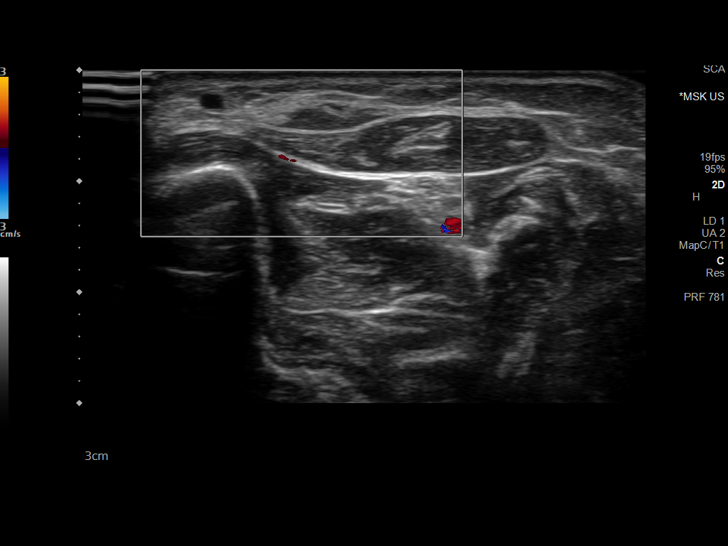
[im 14/15]
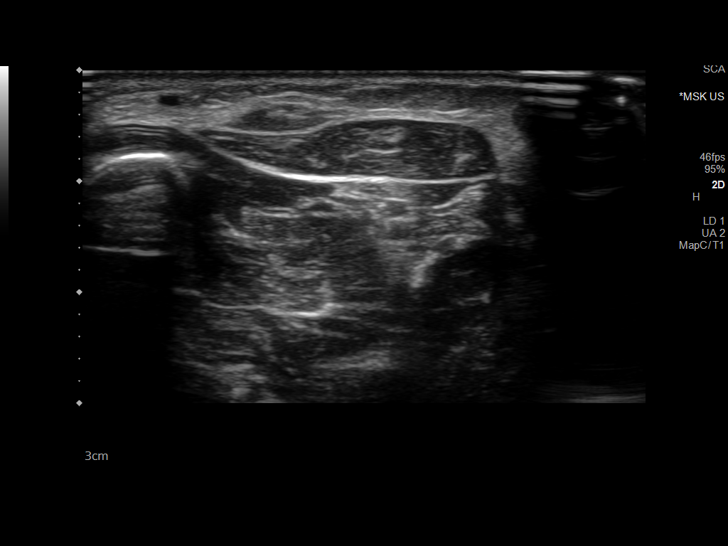
[im 15/15]
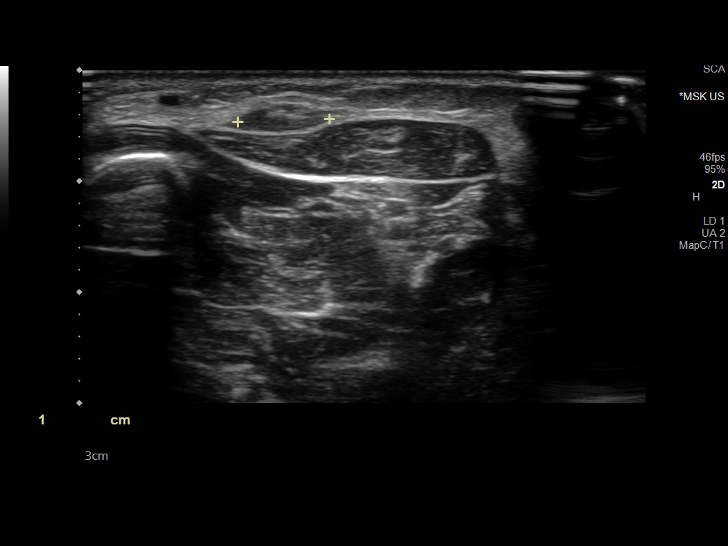

[14 of 15 positions shown; findings below may reference images not displayed]

FINDINGS: There is a lenticular shaped hypoechoic nodule superficially in the
right forearm, corresponding with the patient's palpable concern.
This measures 10 x 3 x 10 mm and demonstrates low-level internal
echoes. There is no abnormal internal blood flow. No other soft
tissue masses or fluid collections are seen.
IMPRESSION: Indeterminate lenticular shaped hypoechoic nodule superficially in
the right forearm, corresponding with the patient's palpable
concern. This demonstrates no aggressive characteristics. Clinical
follow-up recommended. If enlarging, consider further evaluation
with MRI.

## 2021-10-08 ENCOUNTER — Encounter: Payer: Self-pay | Admitting: Gastroenterology

## 2022-01-14 ENCOUNTER — Other Ambulatory Visit: Payer: Self-pay | Admitting: Family

## 2022-03-27 ENCOUNTER — Encounter: Payer: Self-pay | Admitting: Family

## 2022-03-27 ENCOUNTER — Ambulatory Visit (INDEPENDENT_AMBULATORY_CARE_PROVIDER_SITE_OTHER): Payer: BC Managed Care – PPO | Admitting: Family

## 2022-03-27 VITALS — BP 120/76 | HR 94 | Temp 98.4°F | Resp 16 | Ht 65.0 in | Wt 173.0 lb

## 2022-03-27 DIAGNOSIS — Z0000 Encounter for general adult medical examination without abnormal findings: Secondary | ICD-10-CM

## 2022-03-27 DIAGNOSIS — R102 Pelvic and perineal pain: Secondary | ICD-10-CM

## 2022-03-27 DIAGNOSIS — F32A Depression, unspecified: Secondary | ICD-10-CM

## 2022-03-27 DIAGNOSIS — D509 Iron deficiency anemia, unspecified: Secondary | ICD-10-CM | POA: Diagnosis not present

## 2022-03-27 DIAGNOSIS — K219 Gastro-esophageal reflux disease without esophagitis: Secondary | ICD-10-CM | POA: Diagnosis not present

## 2022-03-27 DIAGNOSIS — E538 Deficiency of other specified B group vitamins: Secondary | ICD-10-CM | POA: Diagnosis not present

## 2022-03-27 DIAGNOSIS — Z800 Family history of malignant neoplasm of digestive organs: Secondary | ICD-10-CM

## 2022-03-27 DIAGNOSIS — G4700 Insomnia, unspecified: Secondary | ICD-10-CM | POA: Insufficient documentation

## 2022-03-27 LAB — CBC WITH DIFFERENTIAL/PLATELET
Basophils Absolute: 0 10*3/uL (ref 0.0–0.1)
Basophils Relative: 0.4 % (ref 0.0–3.0)
Eosinophils Absolute: 0.2 10*3/uL (ref 0.0–0.7)
Eosinophils Relative: 2 % (ref 0.0–5.0)
HCT: 37.8 % (ref 36.0–46.0)
Hemoglobin: 12.6 g/dL (ref 12.0–15.0)
Lymphocytes Relative: 14.8 % (ref 12.0–46.0)
Lymphs Abs: 1.6 10*3/uL (ref 0.7–4.0)
MCHC: 33.5 g/dL (ref 30.0–36.0)
MCV: 91.6 fl (ref 78.0–100.0)
Monocytes Absolute: 0.6 10*3/uL (ref 0.1–1.0)
Monocytes Relative: 6 % (ref 3.0–12.0)
Neutro Abs: 8.2 10*3/uL — ABNORMAL HIGH (ref 1.4–7.7)
Neutrophils Relative %: 76.8 % (ref 43.0–77.0)
Platelets: 298 10*3/uL (ref 150.0–400.0)
RBC: 4.12 Mil/uL (ref 3.87–5.11)
RDW: 14.2 % (ref 11.5–15.5)
WBC: 10.6 10*3/uL — ABNORMAL HIGH (ref 4.0–10.5)

## 2022-03-27 LAB — FERRITIN: Ferritin: 64.1 ng/mL (ref 10.0–291.0)

## 2022-03-27 LAB — VITAMIN B12: Vitamin B-12: 199 pg/mL — ABNORMAL LOW (ref 211–911)

## 2022-03-27 MED ORDER — AMITRIPTYLINE HCL 25 MG PO TABS: 25.0000 mg | ORAL_TABLET | Freq: Every day | ORAL | 1 refills | Status: DC

## 2022-03-27 MED ORDER — PANTOPRAZOLE SODIUM 40 MG PO TBEC: 40.0000 mg | DELAYED_RELEASE_TABLET | Freq: Every day | ORAL | 3 refills | Status: DC

## 2022-03-27 NOTE — Patient Instructions (Signed)
Please schedule routine vision exam.  

## 2022-03-27 NOTE — Assessment & Plan Note (Signed)
Reports that this is resolved with some exercises she was provided by PT.

## 2022-03-27 NOTE — Assessment & Plan Note (Signed)
Stable overall on elavil. Continue same.

## 2022-03-27 NOTE — Progress Notes (Signed)
Subjective:   By signing my name below, I, [name], attest that this documentation has been prepared under the direction and in the presence of Debbrah Alar NP, 03/27/2022   Patient ID: Tiffany Lyons, female    DOB: 12/02/1985, 36 y.o.   MRN: [id number]  Chief Complaint  Patient presents with   Annual Exam    HPI Patient is in today for a comprehensive physical exam.  Refill: She is requesting a refill of 25 mg of Amitriptyline.  Reflux: She complains of acid reflux. She states that for the past month, symptoms elevated. She has been taking OTC Omeprazole but reports that it does not alleviate symptoms. She states that she usually does not eat after 18:00. Mood: She reports that someone close to her recently passed away in 02-03-23. She states that she has been undergoing increased amounts of external stressors.  Vape: She occasionally vapes, she reports about once or twice a month. She states that she usually at get together. Menstruation: She reports that she went to an OBGYN on 06/26/2021 for excessive bleeding and pelvic floor pain. She then did physical therapy for her symptoms. She reports that her symptoms are now resolved. Vitamin B12: She is taking 500 MCG of Vitamin B12 supplements.   Iron: She does not take iron supplements.   Sleep: She states that she has been sleeping more than usual which she believes could be due to external circumstances. She is currently taking 25 Mg of Amitriptyline.   She denies having any fever, ear pain, new muscle pain, joint pain, new moles, rashes, congestion, sinus pain, sore throat, palpations, wheezing, n/v/d, constipation, blood in stool, dysuria, frequency, hematuria, headaches, depresssion or anxiety at this time.  Social History: She denies of any changes to her family medical history. She reports that she has two half sisters and one full brother. One of her half sister has scoliosis. Her brother is an alcoholic and drug addict.  She believes that her brother has mental health symptoms. She reports that her maternal grandfather based away.  Colonoscopy: Last completed on 11/09/2016. She is due for a colonoscopy.  Pap Smear: Last completed on 01/01/2021. She reports that she started going to a regular OB in October.  Immunizations: She is UTD on tetanus vaccine.  Dental: She is UTD on dental exams.  Vision: She is not UTD on vision exams  Health Maintenance Due  Topic Date Due   COVID-19 Vaccine (3 - Pfizer risk series) 01/29/2020    Past Medical History:  Diagnosis Date   Depression    Family history of colon cancer    Gallstones    Genetic testing 11/25/2016   Test Results: No pathogenic mutations detected.  Genes Analyzed: 43 genes on [facility] Common Cancers panel (APC, ATM, AXIN2, BARD1, BMPR1A, BRCA1, BRCA2, BRIP1, CDH1, CDKN2A, CHEK2, DICER1, EPCAM, GREM1, HOXB13, KIT, MEN1, MLH1, MSH2, MSH6, MUTYH, NBN, NF1, PALB2, PDGFRA, PMS2, POLD1, POLE, PTEN, RAD50, RAD51C, RAD51D, SDHA, SDHB, SDHC, SDHD, SMAD4, SMARCA4, STK11, TP53, TSC1, TSC2, VHL).   History of kidney stones    Vitamin B12 deficiency     Past Surgical History:  Procedure Laterality Date   CHOLECYSTECTOMY  10/17/12   lap chole   COLONOSCOPY  02/2017   will need repeat every 5 yrs per pt.   WISDOM TOOTH EXTRACTION      Family History  Problem Relation Age of Onset   Arthritis Mother    Hemophilia Mother    Irritable bowel syndrome Mother  Colon cancer Father        first diagnosed at age 42, symptoms at 35   Alcohol abuse Father    Alcohol abuse Brother    Drug abuse Brother    Acute myelogenous leukemia Maternal Grandfather    Diabetes Paternal Grandfather    Colon cancer Paternal Grandfather        Dx 94s; currently 27   Heart disease Paternal Grandfather    Colon cancer Other        pat grandfather's mother   Cancer Other        several siblings of paternal grandfather; unk. types of cancer   Scoliosis Half-Sister      Social History   Socioeconomic History   Marital status: Married    Spouse name: Not on file   Number of children: 1   Years of education: Not on file   Highest education level: Not on file  Occupational History   Occupation: custodian, bus driver  Tobacco Use   Smoking status: Former    Types: Cigarettes    Quit date: 07/31/2013    Years since quitting: 8.6   Smokeless tobacco: Never   Tobacco comments:    Using vapor cigarettes.  Vaping Use   Vaping Use: Some days  Substance and Sexual Activity   Alcohol use: Yes    Alcohol/week: 8.0 standard drinks of alcohol    Types: 8 Standard drinks or equivalent per week    Comment: occ.   Drug use: No   Sexual activity: Yes    Partners: Male  Other Topics Concern   Not on file  Social History Narrative   Works for [employer]      Patient is Married      She has a son - born 2008      She completed HS- enjoys reading, crocheting            Social Determinants of Radio broadcast assistant Strain: Not on file  Food Insecurity: Not on file  Transportation Needs: Not on file  Physical Activity: Not on file  Stress: Not on file  Social Connections: Not on file  Intimate Partner Violence: Not on file    Outpatient Medications Prior to Visit  Medication Sig Dispense Refill   Cyanocobalamin (B-12) 500 MCG TABS Take 1 tablet by mouth daily. 30 tablet    EQ ALLERGY RELIEF 10 MG tablet Take 1 tablet by mouth once daily 30 tablet 0   Multiple Vitamins-Minerals (MULTIVITAMIN WITH MINERALS) tablet Take 1 tablet by mouth daily.     norethindrone-ethinyl estradiol-iron (LARIN FE 1.5/30) 1.5-30 MG-MCG tablet Take 1 tablet by mouth daily. 84 tablet 3   amitriptyline (ELAVIL) 25 MG tablet Take 1 tablet (25 mg total) by mouth at bedtime. Last refill without OV 30 tablet 0   omeprazole (PRILOSEC) 20 MG capsule Take 20 mg by mouth daily.     Facility-Administered Medications Prior to Visit  Medication  Dose Route Frequency Provider Last Rate Last Admin   0.9 %  sodium chloride infusion  500 mL Intravenous Continuous Danis, Estill Cotta III, MD        No Known Allergies  Review of Systems  Constitutional:  Negative for fever.  HENT:  Negative for congestion, ear pain, sinus pain and sore throat.   Respiratory:  Negative for wheezing.   Cardiovascular:  Negative for palpitations.  Gastrointestinal:  Negative for blood in stool, constipation, diarrhea, nausea and vomiting.       (+)  Acid Reflux  Genitourinary:  Negative for dysuria, frequency and hematuria.  Musculoskeletal:  Negative for joint pain and myalgias.  Skin:        (-) New Moles  Neurological:  Negative for headaches.  Psychiatric/Behavioral:  Negative for depression. The patient is not nervous/anxious.        Objective:    Physical Exam Constitutional:      General: She is not in acute distress.    Appearance: Normal appearance. She is not ill-appearing.  HENT:     Head: Normocephalic and atraumatic.     Right Ear: Tympanic membrane, ear canal and external ear normal.     Left Ear: Tympanic membrane, ear canal and external ear normal.     Mouth/Throat:     Pharynx: No oropharyngeal exudate.  Eyes:     Extraocular Movements: Extraocular movements intact.     Pupils: Pupils are equal, round, and reactive to light.  Neck:     Thyroid: No thyromegaly.  Cardiovascular:     Rate and Rhythm: Normal rate and regular rhythm.     Heart sounds: Normal heart sounds. No murmur heard.    No gallop.  Pulmonary:     Effort: Pulmonary effort is normal. No respiratory distress.     Breath sounds: Normal breath sounds. No wheezing or rales.  Abdominal:     General: Bowel sounds are normal. There is no distension.     Palpations: Abdomen is soft.     Tenderness: There is no abdominal tenderness. There is no guarding.  Musculoskeletal:     Comments: 5/5 strength in both upper and lower extremities     Lymphadenopathy:      Cervical: No cervical adenopathy.  Skin:    General: Skin is warm and dry.  Neurological:     Mental Status: She is alert and oriented to person, place, and time.     Deep Tendon Reflexes:     Reflex Scores:      Patellar reflexes are 2+ on the right side and 2+ on the left side. Psychiatric:        Mood and Affect: Mood normal.        Behavior: Behavior normal.        Judgment: Judgment normal.     BP (!) 141/81 (BP Location: Right Arm, Patient Position: Sitting, Cuff Size: Small)   Pulse 94   Temp 98.4 F (36.9 C) (Oral)   Resp 16   Ht _0  (1.651 m)   Wt 173 lb (78.5 kg)   SpO2 100%   BMI 28.79 kg/m  Wt Readings from Last 3 Encounters:  03/27/22 173 lb (78.5 kg)  01/01/21 174 lb (78.9 kg)  11/15/20 172 lb 6.4 oz (78.2 kg)       Assessment & Plan:   Problem List Items Addressed This Visit       Unprioritized   Routine general medical examination at a health care facility - Primary    Discussed healthy diet and regular exercise. Pap up to date. She is now followed by GYN.        Pelvic pain    Reports that this is resolved with some exercises she was provided by PT.       Insomnia    Stable overall on elavil. Continue same.       Gastroesophageal reflux disease    Uncontrolled on otc prilosec. Will change to protonix 40m once daily. Discussed gerd precautions.  Relevant Medications   pantoprazole (PROTONIX) 40 MG tablet   Family history of colon cancer    She will her GI to schedule her 5 year follow up colonoscopy.       Depression    She has had some situational stressors with recent loss of neighbor/close friend.  Otherwise, her mood has been good.       Relevant Medications   amitriptyline (ELAVIL) 25 MG tablet   Other Visit Diagnoses     B12 deficiency       Relevant Orders   B12   Iron deficiency anemia, unspecified iron deficiency anemia type       Relevant Orders   CBC with Differential/Platelet   Ferritin       Meds  ordered this encounter  Medications   pantoprazole (PROTONIX) 40 MG tablet    Sig: Take 1 tablet (40 mg total) by mouth daily.    Dispense:  30 tablet    Refill:  3    Order Specific Question:   Supervising Provider    Answer:   Penni Homans A [[id number]]   amitriptyline (ELAVIL) 25 MG tablet    Sig: Take 1 tablet (25 mg total) by mouth at bedtime. Last refill without OV    Dispense:  90 tablet    Refill:  1    Order Specific Question:   Supervising Provider    Answer:   Penni Homans A [[id number]]    I, Nance Pear, NP, personally preformed the services described in this documentation.  All medical record entries made by the scribe were at my direction and in my presence.  I have reviewed the chart and discharge instructions (if applicable) and agree that the record reflects my personal performance and is accurate and complete. 03/27/2022   I,Amber Collins,acting as a Education administrator for Nance Pear, NP.,have documented all relevant documentation on the behalf of Nance Pear, NP,as directed by  Nance Pear, NP while in the presence of Nance Pear, NP.  Nance Pear, NP

## 2022-03-27 NOTE — Assessment & Plan Note (Signed)
Uncontrolled on otc prilosec. Will change to protonix '40mg'$  once daily. Discussed gerd precautions.

## 2022-03-27 NOTE — Assessment & Plan Note (Signed)
She will her GI to schedule her 5 year follow up colonoscopy.

## 2022-03-27 NOTE — Assessment & Plan Note (Signed)
Discussed healthy diet and regular exercise. Pap up to date. She is now followed by GYN.

## 2022-03-27 NOTE — Assessment & Plan Note (Signed)
She has had some situational stressors with recent loss of neighbor/close friend.  Otherwise, her mood has been good.

## 2022-03-30 ENCOUNTER — Telehealth: Payer: Self-pay | Admitting: Family

## 2022-03-30 NOTE — Telephone Encounter (Signed)
B12 level is low despite her taking the oral b12 supplement. I would recommend that she begin b12 injections:  1046mg IM once weekly x 4 weeks, then once monthly.

## 2022-03-30 NOTE — Telephone Encounter (Signed)
Patient advised of results and provider's comments. She was scheduled for B12 injections once a week for the next 4 Wednesdays.

## 2022-04-01 ENCOUNTER — Ambulatory Visit (INDEPENDENT_AMBULATORY_CARE_PROVIDER_SITE_OTHER): Payer: BC Managed Care – PPO

## 2022-04-01 DIAGNOSIS — E538 Deficiency of other specified B group vitamins: Secondary | ICD-10-CM

## 2022-04-01 MED ORDER — CYANOCOBALAMIN 1000 MCG/ML IJ SOLN
1000.0000 ug | Freq: Once | INTRAMUSCULAR | Status: AC
  Administered 2022-04-01: 1000 ug via INTRAMUSCULAR

## 2022-04-01 NOTE — Progress Notes (Signed)
Tiffany Lyons is a 36 y.o. female presents to the office today for 1/4 weekly B12 injection, per physician's orders. Original order: 03/30/22; "B12 level is low despite her taking the oral b12 supplement. I would recommend that she begin b12 injections:  1018mg IM once weekly x 4 weeks, then once monthly. " B12 1000 mcg IM was administered L deltoid today. Patient tolerated injection. Patient due for follow up labs/provider appt: No.  Patient next injection due: 1 week, appt made Yes- all injections are scheduled.   [place], TDarlis Loan

## 2022-04-08 ENCOUNTER — Ambulatory Visit (INDEPENDENT_AMBULATORY_CARE_PROVIDER_SITE_OTHER): Payer: BC Managed Care – PPO

## 2022-04-08 DIAGNOSIS — E538 Deficiency of other specified B group vitamins: Secondary | ICD-10-CM | POA: Diagnosis not present

## 2022-04-08 MED ORDER — CYANOCOBALAMIN 1000 MCG/ML IJ SOLN
1000.0000 ug | Freq: Once | INTRAMUSCULAR | Status: AC
  Administered 2022-04-08: 1000 ug via INTRAMUSCULAR

## 2022-04-08 NOTE — Progress Notes (Signed)
Tiffany Lyons is a 36 y.o. female presents to the office today for 2/4 weekly B12 injection, per physician's orders. Original order: 03/30/22; "B12 level is low despite her taking the oral b12 supplement. I would recommend that she begin b12 injections: 1048mg IM once weekly x 4 weeks, then once monthly. " B12 1000 mcg IM was administered L deltoid today. Patient tolerated injection. Patient due for follow up labs/provider appt: No.  Patient next injection due: 1 week, appt made Yes- all injections are scheduled.

## 2022-04-15 ENCOUNTER — Ambulatory Visit (INDEPENDENT_AMBULATORY_CARE_PROVIDER_SITE_OTHER): Payer: BC Managed Care – PPO

## 2022-04-15 DIAGNOSIS — E538 Deficiency of other specified B group vitamins: Secondary | ICD-10-CM | POA: Diagnosis not present

## 2022-04-15 MED ORDER — CYANOCOBALAMIN 1000 MCG/ML IJ SOLN
1000.0000 ug | Freq: Once | INTRAMUSCULAR | Status: AC
  Administered 2022-04-15: 1000 ug via INTRAMUSCULAR

## 2022-04-15 NOTE — Progress Notes (Signed)
Tiffany Lyons is a 36 y.o. female presents to the office today for 3/4 weekly B12 injection, per physician's orders. Original order: 03/30/22; "B12 level is low despite her taking the oral b12 supplement. I would recommend that she begin b12 injections: 1058mg IM once weekly x 4 weeks, then once monthly. " B12 1000 mcg IM was administered L deltoid today. Patient tolerated injection. Patient due for follow up labs/provider appt: No.  Patient next injection due: 1 week, appt made Yes- all injections are scheduled.

## 2022-04-22 ENCOUNTER — Ambulatory Visit (INDEPENDENT_AMBULATORY_CARE_PROVIDER_SITE_OTHER): Payer: BC Managed Care – PPO

## 2022-04-22 DIAGNOSIS — E538 Deficiency of other specified B group vitamins: Secondary | ICD-10-CM

## 2022-04-22 MED ORDER — CYANOCOBALAMIN 1000 MCG/ML IJ SOLN
1000.0000 ug | Freq: Once | INTRAMUSCULAR | Status: AC
  Administered 2022-04-22: 1000 ug via INTRAMUSCULAR

## 2022-04-22 NOTE — Progress Notes (Signed)
Tiffany Lyons is a 36 y.o. female presents to the office today for 4 of 4 weekly B12 injection, per physician's orders. Original order: 03/30/22; "B12 level is low despite her taking the oral b12 supplement. I would recommend that she begin b12 injections: 1050mg IM once weekly x 4 weeks, then once monthly. " B12 1000 mcg IM was administered L deltoid today. Patient tolerated injection. Patient due for follow up labs/provider appt: No.  Patient next injection due: 1 week, appt made Yes- all injections are scheduled

## 2022-05-22 ENCOUNTER — Ambulatory Visit (INDEPENDENT_AMBULATORY_CARE_PROVIDER_SITE_OTHER): Payer: BC Managed Care – PPO

## 2022-05-22 DIAGNOSIS — E538 Deficiency of other specified B group vitamins: Secondary | ICD-10-CM | POA: Diagnosis not present

## 2022-05-22 MED ORDER — CYANOCOBALAMIN 1000 MCG/ML IJ SOLN
1000.0000 ug | Freq: Once | INTRAMUSCULAR | Status: AC
  Administered 2022-05-22: 1000 ug via INTRAMUSCULAR

## 2022-05-22 NOTE — Progress Notes (Addendum)
Tiffany Lyons is a 36 y.o. female presents to the office today for monthly B12 injection, per physician's orders. Original order: 03/30/22; "B12 level is low despite her taking the oral b12 supplement. I would recommend that she begin b12 injections:   B12 1000 mcg IM was administered right deltoid today. Patient tolerated injection. Patient due for follow up labs/provider appt: No.  Patient next injection due: In a month, appt made For 06/23/22

## 2022-06-23 ENCOUNTER — Ambulatory Visit: Payer: BC Managed Care – PPO

## 2022-07-03 ENCOUNTER — Ambulatory Visit: Payer: BC Managed Care – PPO

## 2022-07-15 ENCOUNTER — Ambulatory Visit (INDEPENDENT_AMBULATORY_CARE_PROVIDER_SITE_OTHER): Payer: BC Managed Care – PPO

## 2022-07-15 DIAGNOSIS — E538 Deficiency of other specified B group vitamins: Secondary | ICD-10-CM

## 2022-07-15 MED ORDER — CYANOCOBALAMIN 1000 MCG/ML IJ SOLN
1000.0000 ug | Freq: Once | INTRAMUSCULAR | Status: AC
  Administered 2022-07-15: 1000 ug via INTRAMUSCULAR

## 2022-07-15 NOTE — Progress Notes (Signed)
Tiffany Lyons is a 36 y.o. female presents to the office today for monthly B-12  2/4 injection, per physician's orders. Original order: 03/30/22; "B12 level is low despite her taking the oral b12 supplement. I would recommend that she begin b12 injections:   B12 1000 mcg IM was administered right deltoid today. Patient tolerated injection. Patient due for follow up labs/provider appt: No.  Patient next injection due: In a month, appt made

## 2022-08-14 ENCOUNTER — Other Ambulatory Visit: Payer: Self-pay | Admitting: Family

## 2022-08-18 ENCOUNTER — Ambulatory Visit (INDEPENDENT_AMBULATORY_CARE_PROVIDER_SITE_OTHER): Payer: BC Managed Care – PPO

## 2022-08-18 DIAGNOSIS — E538 Deficiency of other specified B group vitamins: Secondary | ICD-10-CM

## 2022-08-18 MED ORDER — CYANOCOBALAMIN 1000 MCG/ML IJ SOLN
1000.0000 ug | Freq: Once | INTRAMUSCULAR | Status: AC
  Administered 2022-08-18: 1000 ug via INTRAMUSCULAR

## 2022-08-18 NOTE — Progress Notes (Signed)
Tiffany Lyons is a 36 y.o. female presents to the office today for 2nd Monthly B12 injection, per physician's orders. Original order: 03/30/22; "B12 level is low despite her taking the oral b12 supplement. I would recommend that she begin b12 injections:  1056mg IM once weekly x 4 weeks, then once monthly. " B12 1000 mcg IM was administered L deltoid today. Patient tolerated injection. Patient due for follow up labs/provider appt: No.  Patient next injection due: 1 month, appt made Yes- all injections are scheduled.

## 2022-09-14 ENCOUNTER — Other Ambulatory Visit: Payer: Self-pay | Admitting: Family

## 2022-09-21 ENCOUNTER — Ambulatory Visit (INDEPENDENT_AMBULATORY_CARE_PROVIDER_SITE_OTHER): Payer: BC Managed Care – PPO | Admitting: *Deleted

## 2022-09-21 DIAGNOSIS — E538 Deficiency of other specified B group vitamins: Secondary | ICD-10-CM

## 2022-09-21 MED ORDER — CYANOCOBALAMIN 1000 MCG/ML IJ SOLN
1000.0000 ug | Freq: Once | INTRAMUSCULAR | Status: AC
  Administered 2022-09-21: 1000 ug via INTRAMUSCULAR

## 2022-09-21 NOTE — Progress Notes (Signed)
Tiffany Lyons is a 36 y.o. female presents to the office today for 2nd Monthly B12 injection, per physician's orders. Original order: 03/30/22; "B12 level is low despite her taking the oral b12 supplement. I would recommend that she begin b12 injections:  1065mg IM once weekly x 4 weeks, then once monthly. " B12 1000 mcg IM was administered L deltoid today. Patient tolerated injection. Patient due for follow up labs/provider appt: No.  Patient next injection due: 1 month, appt made Yes- all injections are scheduled

## 2022-10-02 ENCOUNTER — Ambulatory Visit: Payer: BC Managed Care – PPO | Admitting: Family

## 2022-10-02 VITALS — BP 111/73 | HR 92 | Temp 98.0°F | Resp 16 | Wt 175.0 lb

## 2022-10-02 DIAGNOSIS — N926 Irregular menstruation, unspecified: Secondary | ICD-10-CM | POA: Diagnosis not present

## 2022-10-02 DIAGNOSIS — K219 Gastro-esophageal reflux disease without esophagitis: Secondary | ICD-10-CM

## 2022-10-02 DIAGNOSIS — G4700 Insomnia, unspecified: Secondary | ICD-10-CM | POA: Diagnosis not present

## 2022-10-02 DIAGNOSIS — Z23 Encounter for immunization: Secondary | ICD-10-CM

## 2022-10-02 DIAGNOSIS — F32A Depression, unspecified: Secondary | ICD-10-CM

## 2022-10-02 MED ORDER — AMITRIPTYLINE HCL 25 MG PO TABS: ORAL_TABLET | ORAL | 1 refills | Status: DC

## 2022-10-02 MED ORDER — NORETHIN ACE-ETH ESTRAD-FE 1.5-30 MG-MCG PO TABS: 1.0000 | ORAL_TABLET | Freq: Every day | ORAL | 3 refills | Status: DC

## 2022-10-02 NOTE — Assessment & Plan Note (Signed)
Stable, on elavil.

## 2022-10-02 NOTE — Assessment & Plan Note (Signed)
Stable/improved on pantoprazole. She feels that she continues to need it daily.

## 2022-10-02 NOTE — Progress Notes (Signed)
Subjective:   By signing my name below, I, Tiffany Lyons, attest that this documentation has been prepared under the direction and in the presence of [place], NP 10/02/2022     Patient ID: Tiffany Lyons, female    DOB: October 12, 1986, 36 y.o.   MRN: [id number]  Chief Complaint  Patient presents with   Depression    Here for follow up    HPI Patient is in today for an office visit  Reflux: Since last visit, she was prescribed OTC 40 mg of Protonix. She states that symptoms are improving and can tell when she is not taking the medication.   Sleep: She is currently taking 25 mg of Amitriptyline. Her stressors are improving and therefore her sleep is improving.   Menstrual Cycle: She reports of irregular cycles and states that she has not had a cycle since October of this year. She is currently not taking Larin Fe 1.5/30 mg-mcg.   Colonoscopy: She reports that she has not scheduled a follow-up yet but does have the information to call.   Mood: She reports that her symptoms are stable.   Vitamin B-12: She is currently receiving regular Vitamin B-12 injections. Her last Vitamin B-12 injection was on 09/21/2022.   Immunizations: She is interested in receiving an influenza vaccine during today's visit.   Health Maintenance Due  Topic Date Due   COVID-19 Vaccine (3 - Pfizer risk series) 01/29/2020   INFLUENZA VACCINE  05/12/2022    Past Medical History:  Diagnosis Date   Depression    Family history of colon cancer    Gallstones    Genetic testing 11/25/2016   Test Results: No pathogenic mutations detected.  Genes Analyzed: 43 genes on [facility] Common Cancers panel (APC, ATM, AXIN2, BARD1, BMPR1A, BRCA1, BRCA2, BRIP1, CDH1, CDKN2A, CHEK2, DICER1, EPCAM, GREM1, HOXB13, KIT, MEN1, MLH1, MSH2, MSH6, MUTYH, NBN, NF1, PALB2, PDGFRA, PMS2, POLD1, POLE, PTEN, RAD50, RAD51C, RAD51D, SDHA, SDHB, SDHC, SDHD, SMAD4, SMARCA4, STK11, TP53, TSC1, TSC2, VHL).   History of kidney stones     Vitamin B12 deficiency     Past Surgical History:  Procedure Laterality Date   CHOLECYSTECTOMY  10/17/12   lap chole   COLONOSCOPY  02/2017   will need repeat every 5 yrs per pt.   WISDOM TOOTH EXTRACTION      Family History  Problem Relation Age of Onset   Arthritis Mother    Hemophilia Mother    Irritable bowel syndrome Mother    Colon cancer Father        first diagnosed at age 68, symptoms at 53   Alcohol abuse Father    Alcohol abuse Brother    Drug abuse Brother    Acute myelogenous leukemia Maternal Grandfather    Diabetes Paternal Grandfather    Colon cancer Paternal Grandfather        Dx 35s; currently 48   Heart disease Paternal Grandfather    Colon cancer Other        pat grandfather's mother   Cancer Other        several siblings of paternal grandfather; unk. types of cancer   Scoliosis Half-Sister     Social History   Socioeconomic History   Marital status: Married    Spouse name: Not on file   Number of children: 1   Years of education: Not on file   Highest education level: Not on file  Occupational History   Occupation: custodian, bus driver  Tobacco Use  Smoking status: Former    Types: Cigarettes    Quit date: 07/31/2013    Years since quitting: 9.1   Smokeless tobacco: Never   Tobacco comments:    Using vapor cigarettes.  Vaping Use   Vaping Use: Some days  Substance and Sexual Activity   Alcohol use: Yes    Alcohol/week: 8.0 standard drinks of alcohol    Types: 8 Standard drinks or equivalent per week    Comment: occ.   Drug use: No   Sexual activity: Yes    Partners: Male  Other Topics Concern   Not on file  Social History Narrative   Works for [employer]      Patient is Married      She has a son - born 2008      She completed HS- enjoys reading, crocheting            Social Determinants of Radio broadcast assistant Strain: Not on file  Food Insecurity: Not on file  Transportation Needs:  Not on file  Physical Activity: Not on file  Stress: Not on file  Social Connections: Not on file  Intimate Partner Violence: Not on file    Outpatient Medications Prior to Visit  Medication Sig Dispense Refill   EQ ALLERGY RELIEF 10 MG tablet Take 1 tablet by mouth once daily 30 tablet 0   Multiple Vitamins-Minerals (MULTIVITAMIN WITH MINERALS) tablet Take 1 tablet by mouth daily.     pantoprazole (PROTONIX) 40 MG tablet Take 1 tablet by mouth once daily 90 tablet 1   amitriptyline (ELAVIL) 25 MG tablet TAKE 1 TABLET BY MOUTH AT BEDTIME . APPOINTMENT REQUIRED FOR FUTURE REFILLS 90 tablet 0   norethindrone-ethinyl estradiol-iron (LARIN FE 1.5/30) 1.5-30 MG-MCG tablet Take 1 tablet by mouth daily. 84 tablet 3   Cyanocobalamin (B-12) 500 MCG TABS Take 1 tablet by mouth daily. 30 tablet    Facility-Administered Medications Prior to Visit  Medication Dose Route Frequency Provider Last Rate Last Admin   0.9 %  sodium chloride infusion  500 mL Intravenous Continuous Danis, Estill Cotta III, MD        No Known Allergies  ROS    See HPI Objective:    Physical Exam Constitutional:      General: She is not in acute distress.    Appearance: Normal appearance. She is not ill-appearing.  HENT:     Head: Normocephalic and atraumatic.     Right Ear: External ear normal.     Left Ear: External ear normal.  Eyes:     Extraocular Movements: Extraocular movements intact.     Pupils: Pupils are equal, round, and reactive to light.  Cardiovascular:     Rate and Rhythm: Normal rate and regular rhythm.     Heart sounds: Normal heart sounds. No murmur heard.    No gallop.  Pulmonary:     Effort: Pulmonary effort is normal. No respiratory distress.     Breath sounds: Normal breath sounds. No wheezing or rales.  Skin:    General: Skin is warm and dry.  Neurological:     Mental Status: She is alert and oriented to person, place, and time.  Psychiatric:        Mood and Affect: Mood normal.         Behavior: Behavior normal.        Judgment: Judgment normal.     BP 111/73 (BP Location: Right Arm, Patient Position: Sitting, Cuff Size: Small)  Pulse 92   Temp 98 F (36.7 C) (Oral)   Resp 16   Wt 175 lb (79.4 kg)   SpO2 100%   BMI 29.12 kg/m  Wt Readings from Last 3 Encounters:  10/02/22 175 lb (79.4 kg)  03/27/22 173 lb (78.5 kg)  01/01/21 174 lb (78.9 kg)       Assessment & Plan:   Problem List Items Addressed This Visit       Unprioritized   Irregular menses    She did not bleed at the end of her last OCP pack, then discontinued. No period x 8 weeks. She has taken several negative pregnancy tests at home. Declines pregnancy test today. Advised pt to complete home pregnancy test, if negative recommend she restart OCP.       Insomnia    Stable on elavil. Continue same.       Gastroesophageal reflux disease    Stable/improved on pantoprazole. She feels that she continues to need it daily.       Depression - Primary    Stable, on elavil.       Relevant Medications   amitriptyline (ELAVIL) 25 MG tablet   Meds ordered this encounter  Medications   norethindrone-ethinyl estradiol-iron (LARIN FE 1.5/30) 1.5-30 MG-MCG tablet    Sig: Take 1 tablet by mouth daily.    Dispense:  84 tablet    Refill:  3    Order Specific Question:   Supervising Provider    Answer:   Penni Homans A [[id number]]   amitriptyline (ELAVIL) 25 MG tablet    Sig: TAKE 1 TABLET BY MOUTH AT BEDTIME .    Dispense:  90 tablet    Refill:  1    Order Specific Question:   Supervising Provider    Answer:   Penni Homans A [[id number]]    I, Nance Pear, NP, personally preformed the services described in this documentation.  All medical record entries made by the scribe were at my direction and in my presence.  I have reviewed the chart and discharge instructions (if applicable) and agree that the record reflects my personal performance and is accurate and complete. 10/02/2022   I,Amber  Collins,acting as a scribe for Nance Pear, NP.,have documented all relevant documentation on the behalf of Nance Pear, NP,as directed by  Nance Pear, NP while in the presence of Nance Pear, NP.    Nance Pear, NP

## 2022-10-02 NOTE — Assessment & Plan Note (Signed)
She did not bleed at the end of her last OCP pack, then discontinued. No period x 8 weeks. She has taken several negative pregnancy tests at home. Declines pregnancy test today. Advised pt to complete home pregnancy test, if negative recommend she restart OCP.

## 2022-10-02 NOTE — Assessment & Plan Note (Signed)
Stable on elavil. Continue same.

## 2022-10-02 NOTE — Addendum Note (Signed)
Addended by: Jiles Prows on: 10/02/2022 11:50 AM   Modules accepted: Orders

## 2022-10-23 ENCOUNTER — Ambulatory Visit (INDEPENDENT_AMBULATORY_CARE_PROVIDER_SITE_OTHER): Payer: BC Managed Care – PPO | Admitting: *Deleted

## 2022-10-23 DIAGNOSIS — E538 Deficiency of other specified B group vitamins: Secondary | ICD-10-CM

## 2022-10-23 MED ORDER — CYANOCOBALAMIN 1000 MCG/ML IJ SOLN
1000.0000 ug | Freq: Once | INTRAMUSCULAR | Status: AC
  Administered 2022-10-23: 1000 ug via INTRAMUSCULAR

## 2022-10-23 NOTE — Progress Notes (Signed)
Tiffany Lyons is a 37 y.o. female presents to the office today Monthly B12 injection, per physician's orders. Original order: 03/30/22; "B12 level is low despite her taking the oral b12 supplement. I would recommend that she begin b12 injections:  1021mg IM once weekly x 4 weeks, then once monthly. " B12 1000 mcg IM was administered R deltoid today. Patient tolerated injection. Patient due for follow up labs/provider appt: No.  Patient next injection due: 1 month, appt made Yes

## 2022-11-10 IMAGING — US US PELVIS COMPLETE TRANSABD/TRANSVAG W DUPLEX
2 series · 13 of 25 positions shown · non-contrast
Comparison: None.

CLINICAL DATA: Right-sided pelvic pain

EXAM:
TRANSABDOMINAL AND TRANSVAGINAL ULTRASOUND OF PELVIS
DOPPLER ULTRASOUND OF OVARIES
TECHNIQUE: Both transabdominal and transvaginal ultrasound examinations of the
pelvis were performed. Transabdominal technique was performed for
global imaging of the pelvis including uterus, ovaries, adnexal
regions, and pelvic cul-de-sac.
It was necessary to proceed with endovaginal exam following the
transabdominal exam to visualize the uterus endometrium ovaries.
Color and duplex Doppler ultrasound was utilized to evaluate blood
flow to the ovaries.

[Series 1: us pelvis complete transabd/transvag w duplex · 6 of 44 slices shown (1 of 2)]
[im 1/44]
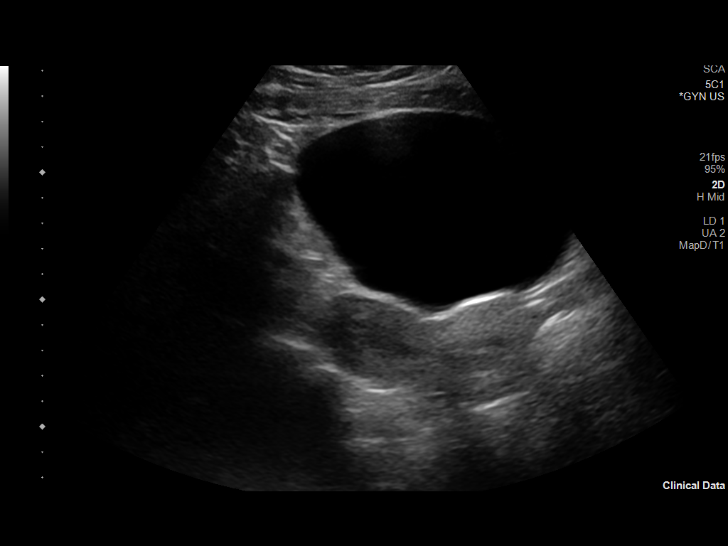
[im 8/44]
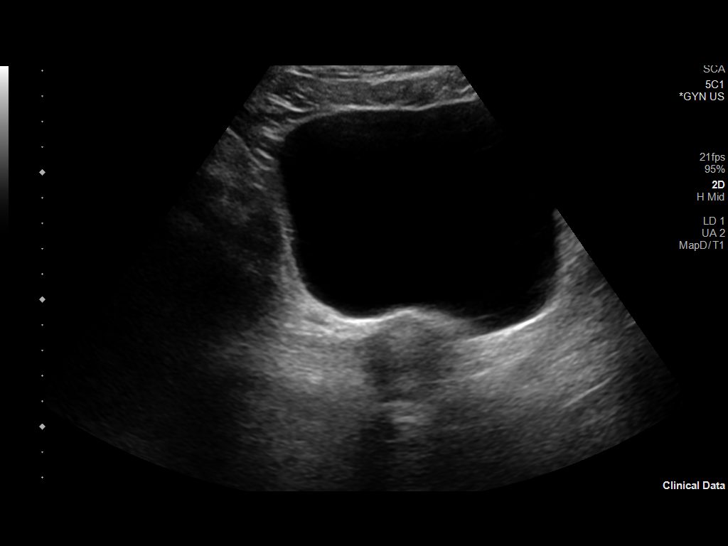
[im 16/44]
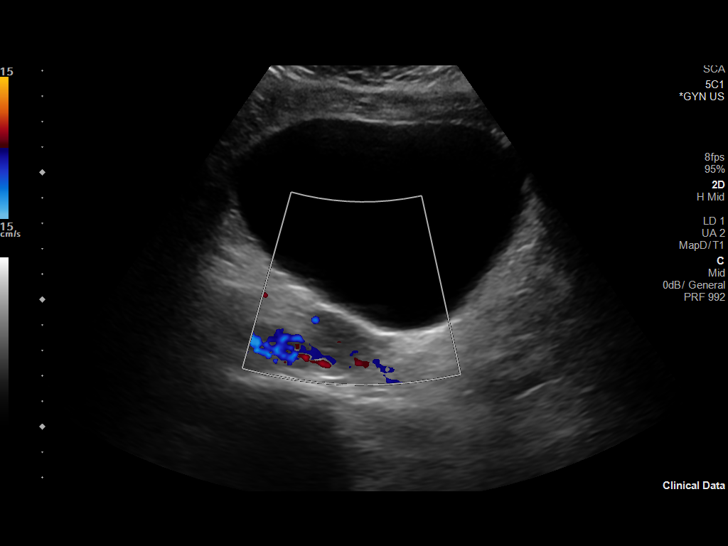
[im 24/44]
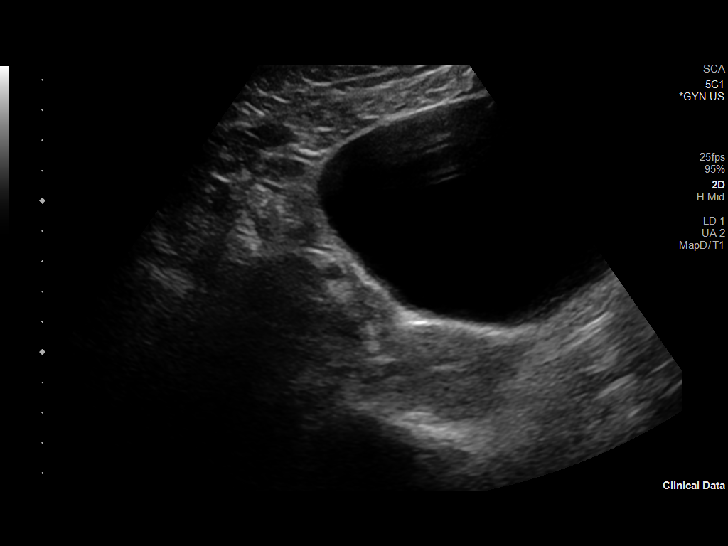
[im 32/44]
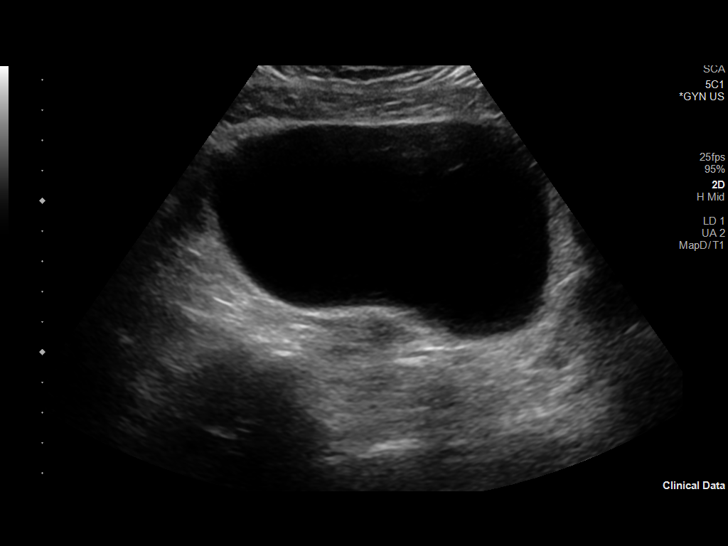
[im 40/44]
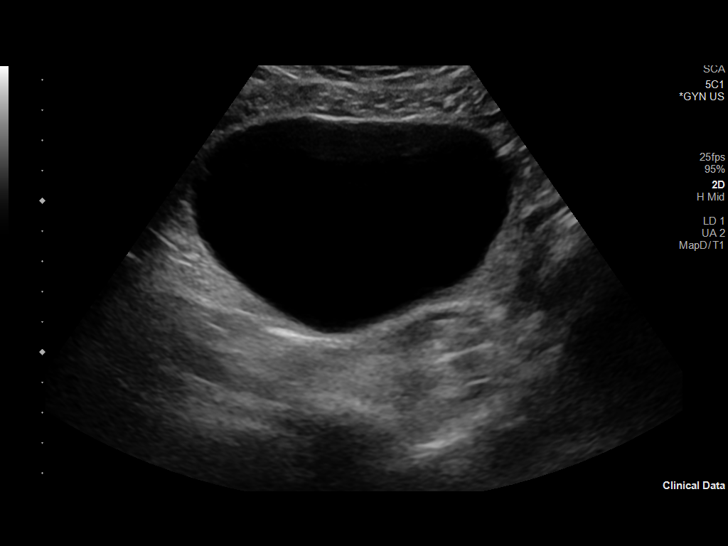

[Series 2: us pelvis complete transabd/transvag w duplex · 7 of 47 slices shown (2 of 2)]
[im 1/47]
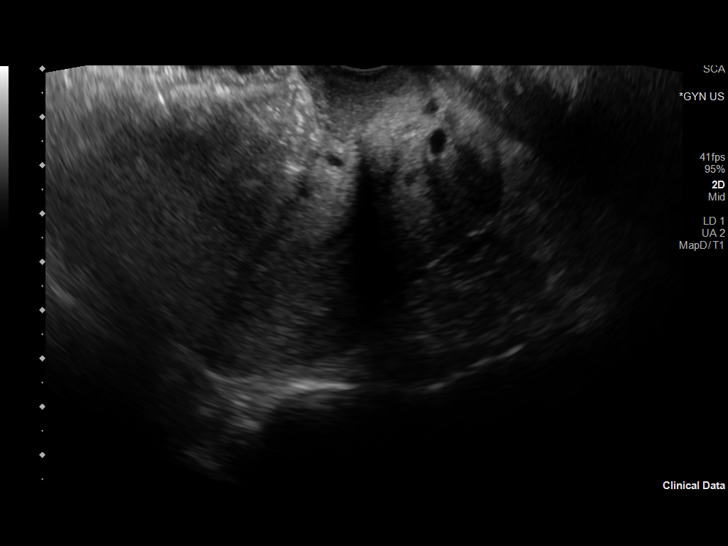
[im 8/47]
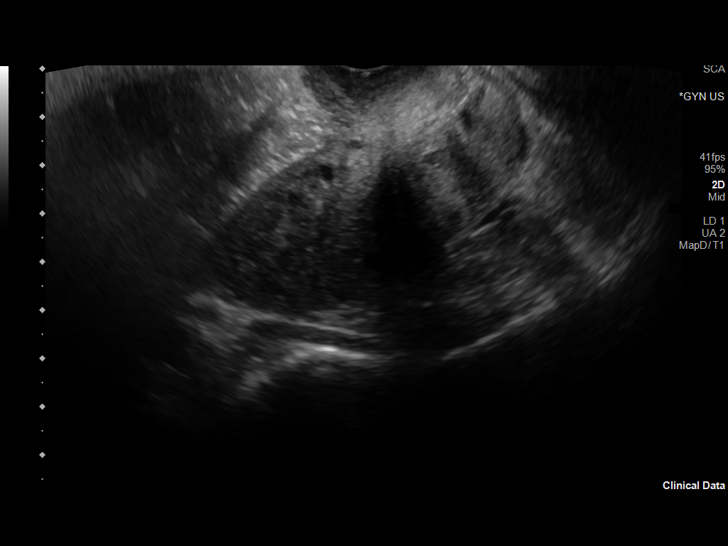
[im 16/47]
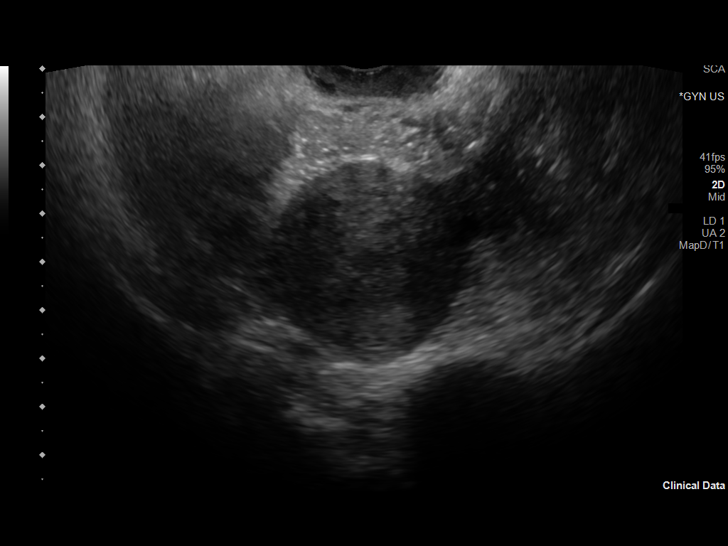
[im 24/47]
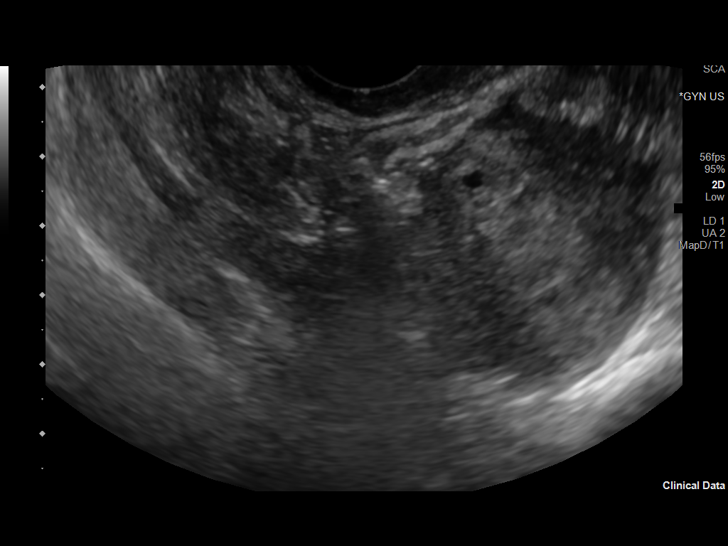
[im 31/47]
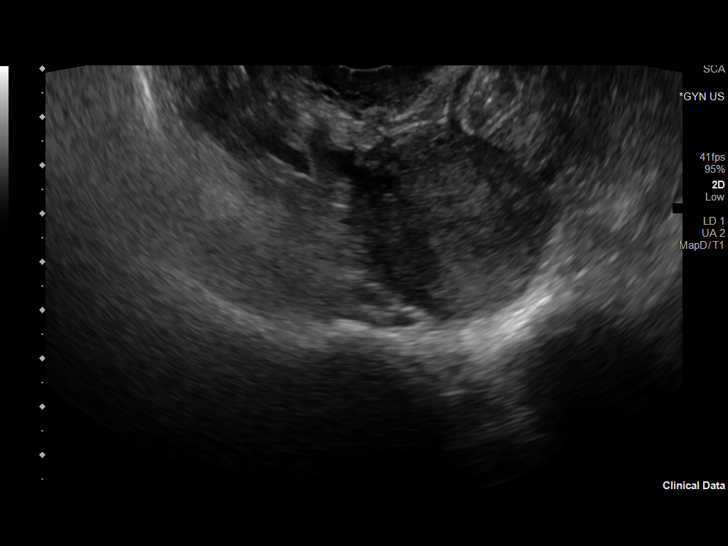
[im 39/47]
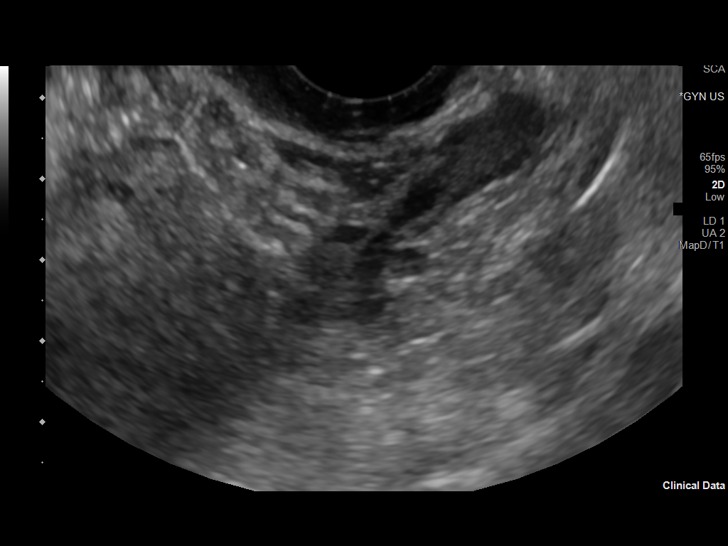
[im 47/47]
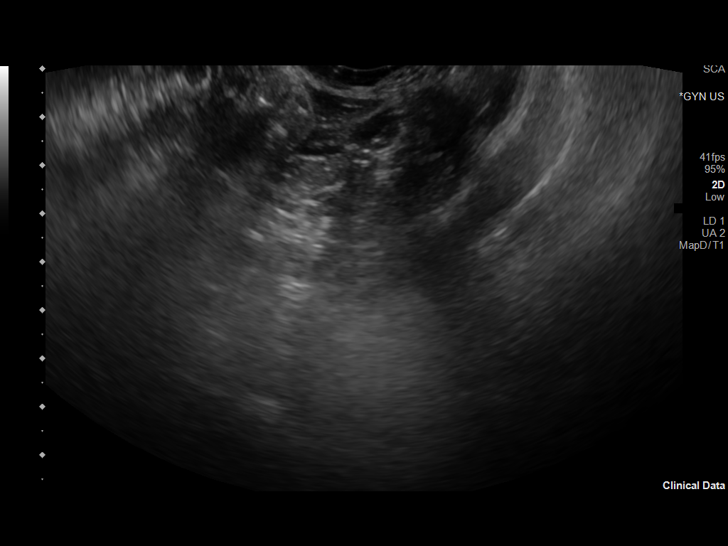

[13 of 25 positions shown; findings below may reference images not displayed]

FINDINGS: Uterus

Measurements: 7.8 x 3.9 x 4.1 cm = volume: 65 mL. No fibroids or
other mass visualized.

Endometrium

Thickness: 2.6 mm.  No focal abnormality visualized.

Right ovary

Measurements: 1.6 x 1.2 x 1.1 cm = volume: 1.1 mL. Normal
appearance/no adnexal mass.

Left ovary

Measurements: 2.3 x 0.8 x 1.2 cm = volume: 1.2 mL. Normal
appearance/no adnexal mass.

Pulsed Doppler evaluation of both ovaries demonstrates normal
low-resistance arterial and venous waveforms.

Other findings

No abnormal free fluid.
IMPRESSION: Negative pelvic ultrasound

## 2022-11-26 ENCOUNTER — Ambulatory Visit (INDEPENDENT_AMBULATORY_CARE_PROVIDER_SITE_OTHER): Payer: BC Managed Care – PPO

## 2022-11-26 DIAGNOSIS — E538 Deficiency of other specified B group vitamins: Secondary | ICD-10-CM

## 2022-11-26 MED ORDER — CYANOCOBALAMIN 1000 MCG/ML IJ SOLN
1000.0000 ug | Freq: Once | INTRAMUSCULAR | Status: AC
  Administered 2022-11-26: 1000 ug via INTRAMUSCULAR

## 2022-11-26 NOTE — Progress Notes (Signed)
Tiffany Lyons is a 37 y.o. female presents to the office today Monthly 4th B12 injection, per physician's orders. Original order: 03/30/22; "B12 level is low despite her taking the oral b12 supplement. I would recommend that she begin b12 injections: 102mg IM once weekly x 4 weeks, then once monthly. " B12 1000 mcg IM was administered R deltoid today. Patient tolerated injection. Patient due for follow up labs/provider appt: No. Patient next injection due: 1 month, appt made Yes

## 2022-12-25 ENCOUNTER — Ambulatory Visit (INDEPENDENT_AMBULATORY_CARE_PROVIDER_SITE_OTHER): Payer: BC Managed Care – PPO

## 2022-12-25 DIAGNOSIS — E538 Deficiency of other specified B group vitamins: Secondary | ICD-10-CM

## 2022-12-25 MED ORDER — CYANOCOBALAMIN 1000 MCG/ML IJ SOLN
1000.0000 ug | Freq: Once | INTRAMUSCULAR | Status: AC
  Administered 2022-12-25: 1000 ug via INTRAMUSCULAR

## 2022-12-25 NOTE — Progress Notes (Signed)
Tiffany Lyons is a 37 y.o. female presents to the office today Monthly 4th B12 injection, per physician's orders. Original order: 03/30/22; "B12 level is low despite her taking the oral b12 supplement. I would recommend that she begin b12 injections: 1066mcg IM once weekly x 4 weeks, then once monthly. " B12 1000 mcg IM was administered L deltoid today. Patient tolerated injection. Patient due for follow up labs/provider appt: No. Patient next injection due: 1 month, appt made Yes

## 2023-01-29 ENCOUNTER — Ambulatory Visit (INDEPENDENT_AMBULATORY_CARE_PROVIDER_SITE_OTHER): Payer: BC Managed Care – PPO

## 2023-01-29 DIAGNOSIS — E538 Deficiency of other specified B group vitamins: Secondary | ICD-10-CM | POA: Diagnosis not present

## 2023-01-29 MED ORDER — CYANOCOBALAMIN 1000 MCG/ML IJ SOLN
1000.0000 ug | Freq: Once | INTRAMUSCULAR | Status: AC
  Administered 2023-01-29: 1000 ug via INTRAMUSCULAR

## 2023-01-29 NOTE — Progress Notes (Signed)
Tiffany Lyons is a 36 y.o. female presents to the office today Monthly 4th B12 injection, per physician's orders. Original order: 03/30/22; "B12 level is low despite her taking the oral b12 supplement. I would recommend that she begin b12 injections: 1000mcg IM once weekly x 4 weeks, then once monthly. " B12 1000 mcg IM was administered L deltoid today. Patient tolerated injection. Patient due for follow up labs/provider appt: No. Patient next injection due: 1 month, appt made Yes    

## 2023-02-11 ENCOUNTER — Other Ambulatory Visit: Payer: Self-pay | Admitting: Family

## 2023-02-26 ENCOUNTER — Ambulatory Visit (INDEPENDENT_AMBULATORY_CARE_PROVIDER_SITE_OTHER): Payer: BC Managed Care – PPO

## 2023-02-26 DIAGNOSIS — E538 Deficiency of other specified B group vitamins: Secondary | ICD-10-CM

## 2023-02-26 MED ORDER — CYANOCOBALAMIN 1000 MCG/ML IJ SOLN
1000.0000 ug | Freq: Once | INTRAMUSCULAR | Status: AC
  Administered 2023-02-26: 1000 ug via INTRAMUSCULAR

## 2023-02-26 NOTE — Progress Notes (Signed)
Tiffany Lyons is a 36 y.o. female presents to the office today Monthly 4th B12 injection, per physician's orders. Original order: 03/30/22; "B12 level is low despite her taking the oral b12 supplement. I would recommend that she begin b12 injections: 1000mcg IM once weekly x 4 weeks, then once monthly. " B12 1000 mcg IM was administered L deltoid today. Patient tolerated injection. Patient due for follow up labs/provider appt: No. Patient next injection due: 1 month, appt made Yes    

## 2023-04-02 ENCOUNTER — Ambulatory Visit: Payer: BC Managed Care – PPO

## 2023-04-02 ENCOUNTER — Ambulatory Visit: Payer: BC Managed Care – PPO | Admitting: Family

## 2023-04-02 VITALS — BP 121/72 | HR 96 | Temp 98.8°F | Wt 177.0 lb

## 2023-04-02 DIAGNOSIS — N926 Irregular menstruation, unspecified: Secondary | ICD-10-CM

## 2023-04-02 DIAGNOSIS — E538 Deficiency of other specified B group vitamins: Secondary | ICD-10-CM

## 2023-04-02 DIAGNOSIS — K219 Gastro-esophageal reflux disease without esophagitis: Secondary | ICD-10-CM

## 2023-04-02 DIAGNOSIS — E611 Iron deficiency: Secondary | ICD-10-CM

## 2023-04-02 DIAGNOSIS — G4700 Insomnia, unspecified: Secondary | ICD-10-CM

## 2023-04-02 DIAGNOSIS — F32A Depression, unspecified: Secondary | ICD-10-CM

## 2023-04-02 LAB — VITAMIN B12: Vitamin B-12: 203 pg/mL — ABNORMAL LOW (ref 211–911)

## 2023-04-02 MED ORDER — AMITRIPTYLINE HCL 25 MG PO TABS: ORAL_TABLET | ORAL | 1 refills | Status: DC

## 2023-04-02 MED ORDER — PANTOPRAZOLE SODIUM 40 MG PO TBEC: 40.0000 mg | DELAYED_RELEASE_TABLET | Freq: Every day | ORAL | 1 refills | Status: DC

## 2023-04-02 MED ORDER — CYANOCOBALAMIN 1000 MCG/ML IJ SOLN
1000.0000 ug | Freq: Once | INTRAMUSCULAR | Status: AC
  Administered 2023-04-02: 1000 ug via INTRAMUSCULAR

## 2023-04-02 NOTE — Assessment & Plan Note (Signed)
Stable with OCP.  Monitor.

## 2023-04-02 NOTE — Assessment & Plan Note (Signed)
Last iron level was normal.

## 2023-04-02 NOTE — Assessment & Plan Note (Signed)
Stable.  Elavil likely helps with both depression and insomnia. Monitor.

## 2023-04-02 NOTE — Progress Notes (Deleted)
Lucinda Spells is a 37 y.o. female presents to the office today for ***:*** *** injections, per physician's orders. Original order: *** *** (med), *** (dose),  *** (route) was administered *** (location) today. Patient tolerated injection. Patient due for follow up labs/provider appt: {yes/no:20286}. Date due: ***, appt made {yes/no:20286} Patient next injection due: ***, appt made {yes/no:20286}  Creft, Melton Alar L

## 2023-04-02 NOTE — Assessment & Plan Note (Signed)
Stable on pantoprazole.  Has not tolerated coming off of it.  Monitor.

## 2023-04-02 NOTE — Assessment & Plan Note (Signed)
Stable with elavil at bedtime.

## 2023-04-02 NOTE — Progress Notes (Signed)
Subjective:     Patient ID: Tiffany Lyons, female    DOB: 1985/12/03, 37 y.o.   MRN: [id number]  Chief Complaint  Patient presents with   Insomnia    "Doing well on medication"   Depression    Here for follow up    Insomnia PMH includes: depression.   Depression        Associated symptoms include insomnia.   Discussed the use of AI scribe software for clinical note transcription with the patient, who gave verbal consent to proceed.  History of Present Illness   The patient, with a history of insomnia, gastroesophageal reflux disease (GERD), and seasonal allergies, presents for a routine follow-up. She reports good control of her insomnia with amitriptyline, which she takes most nights, except on weekends when she stays up late to watch international concerts. She has no concerns about depression.  Her GERD is well controlled with daily pantoprazole, and she notices a return of symptoms if she misses a dose. She continues to take Loestrin for birth control without any issues. Her seasonal allergies are currently under control, and she takes loratadine as needed.  She also receives monthly B12 injections for an unspecified condition. She has not had unusually heavy periods since last year. 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          Health Maintenance Due  Topic Date Due   COVID-19 Vaccine (3 - Pfizer risk series) 01/29/2020    Past Medical History:  Diagnosis Date   Depression    Family history of colon cancer    Gallstones    Genetic testing 11/25/2016   Test Results: No pathogenic mutations detected.  Genes Analyzed: 43 genes on [facility] Common Cancers panel (APC, ATM, AXIN2, BARD1, BMPR1A, BRCA1, BRCA2, BRIP1, CDH1, CDKN2A, CHEK2, DICER1, EPCAM, GREM1, HOXB13, KIT, MEN1, MLH1, MSH2, MSH6, MUTYH, NBN, NF1, PALB2, PDGFRA, PMS2, POLD1, POLE, PTEN, RAD50, RAD51C, RAD51D, SDHA, SDHB, SDHC, SDHD, SMAD4, SMARCA4,  STK11, TP53, TSC1, TSC2, VHL).   History of kidney stones    Vitamin B12 deficiency     Past Surgical History:  Procedure Laterality Date   CHOLECYSTECTOMY  10/17/12   lap chole   COLONOSCOPY  02/2017   will need repeat every 5 yrs per pt.   WISDOM TOOTH EXTRACTION      Family History  Problem Relation Age of Onset   Arthritis Mother    Hemophilia Mother    Irritable bowel syndrome Mother    Colon cancer Father        first diagnosed at age 76, symptoms at 32   Alcohol abuse Father    Alcohol abuse Brother    Drug abuse Brother    Acute myelogenous leukemia Maternal Grandfather    Diabetes Paternal Grandfather    Colon cancer Paternal Grandfather        Dx 17s; currently 9   Heart disease Paternal Grandfather    Colon cancer Other        pat grandfather's mother   Cancer Other        several siblings of paternal grandfather; unk. types of cancer   Scoliosis Half-Sister     Social History   Socioeconomic History   Marital status: Married    Spouse name: Not on file   Number of children: 1   Years of education: Not on file   Highest education level: 12th grade  Occupational History   Occupation: custodian, bus  driver  Tobacco Use   Smoking status: Former    Types: Cigarettes    Quit date: 07/31/2013    Years since quitting: 9.6   Smokeless tobacco: Never   Tobacco comments:    Using vapor cigarettes.  Vaping Use   Vaping Use: Some days  Substance and Sexual Activity   Alcohol use: Yes    Alcohol/week: 8.0 standard drinks of alcohol    Types: 8 Standard drinks or equivalent per week    Comment: occ.   Drug use: No   Sexual activity: Yes    Partners: Male  Other Topics Concern   Not on file  Social History Narrative   Works for [employer]      Patient is Married      She has a son - born 2008      She completed HS- enjoys reading, crocheting            Social Determinants of Health   Financial Resource Strain: Low Risk   (04/01/2023)   Overall Financial Resource Strain (CARDIA)    Difficulty of Paying Living Expenses: Not very hard  Food Insecurity: No Food Insecurity (04/01/2023)   Hunger Vital Sign    Worried About Running Out of Food in the Last Year: Never true    Ran Out of Food in the Last Year: Never true  Transportation Needs: No Transportation Needs (04/01/2023)   PRAPARE - Administrator, Civil Service (Medical): No    Lack of Transportation (Non-Medical): No  Physical Activity: Insufficiently Active (04/01/2023)   Exercise Vital Sign    Days of Exercise per Week: 4 days    Minutes of Exercise per Session: 20 min  Stress: Stress Concern Present (04/01/2023)   [employer] of Occupational Health - Occupational Stress Questionnaire    Feeling of Stress : To some extent  Social Connections: Moderately Isolated (04/01/2023)   Social Connection and Isolation Panel [NHANES]    Frequency of Communication with Friends and Family: More than three times a week    Frequency of Social Gatherings with Friends and Family: Once a week    Attends Religious Services: Never    Database administrator or Organizations: No    Attends Engineer, structural: Not on file    Marital Status: Married  Catering manager Violence: Not on file    Outpatient Medications Prior to Visit  Medication Sig Dispense Refill   EQ ALLERGY RELIEF 10 MG tablet Take 1 tablet by mouth once daily 30 tablet 0   Multiple Vitamins-Minerals (MULTIVITAMIN WITH MINERALS) tablet Take 1 tablet by mouth daily.     norethindrone-ethinyl estradiol-iron (LARIN FE 1.5/30) 1.5-30 MG-MCG tablet Take 1 tablet by mouth daily. 84 tablet 3   amitriptyline (ELAVIL) 25 MG tablet TAKE 1 TABLET BY MOUTH AT BEDTIME . 90 tablet 1   pantoprazole (PROTONIX) 40 MG tablet Take 1 tablet by mouth once daily 90 tablet 0   Facility-Administered Medications Prior to Visit  Medication Dose Route Frequency Provider Last Rate Last Admin   0.9 %   sodium chloride infusion  500 mL Intravenous Continuous Danis, Starr Lake III, MD        No Known Allergies  Review of Systems  Psychiatric/Behavioral:  Positive for depression. The patient has insomnia.        Objective:    Physical Exam Constitutional:      General: She is not in acute distress.    Appearance: Normal appearance.  She is well-developed.  HENT:     Head: Normocephalic and atraumatic.     Right Ear: External ear normal.     Left Ear: External ear normal.  Eyes:     General: No scleral icterus. Neck:     Thyroid: No thyromegaly.  Cardiovascular:     Rate and Rhythm: Normal rate and regular rhythm.     Heart sounds: Normal heart sounds. No murmur heard. Pulmonary:     Effort: Pulmonary effort is normal. No respiratory distress.     Breath sounds: Normal breath sounds. No wheezing.  Musculoskeletal:     Cervical back: Neck supple.  Skin:    General: Skin is warm and dry.  Neurological:     Mental Status: She is alert and oriented to person, place, and time.  Psychiatric:        Mood and Affect: Mood normal.        Behavior: Behavior normal.        Thought Content: Thought content normal.        Judgment: Judgment normal.      BP 121/72 (BP Location: Right Arm, Patient Position: Sitting, Cuff Size: Small)   Pulse 96   Temp 98.8 F (37.1 C) (Oral)   Wt 177 lb (80.3 kg)   SpO2 100%   BMI 29.45 kg/m  Wt Readings from Last 3 Encounters:  04/02/23 177 lb (80.3 kg)  10/02/22 175 lb (79.4 kg)  03/27/22 173 lb (78.5 kg)       Assessment & Plan:   Problem List Items Addressed This Visit       Unprioritized   Irregular menses    Stable with OCP.  Monitor.       Iron deficiency    Last iron level was normal.       Insomnia    Stable with elavil at bedtime.        Gastroesophageal reflux disease    Stable on pantoprazole.  Has not tolerated coming off of it.  Monitor.       Relevant Medications   pantoprazole (PROTONIX) 40 MG tablet    Depression    Stable.  Elavil likely helps with both depression and insomnia. Monitor.       Relevant Medications   amitriptyline (ELAVIL) 25 MG tablet   Other Visit Diagnoses     B12 deficiency    -  Primary   Relevant Medications   cyanocobalamin (VITAMIN B12) injection 1,000 mcg (Completed)   Other Relevant Orders   B12 (Completed)       I have changed Tiffany Lyons "Artelia Mendonsa"'s pantoprazole. I am also having her maintain her multivitamin with minerals, EQ Allergy Relief, norethindrone-ethinyl estradiol-iron, and amitriptyline. We administered cyanocobalamin. We will continue to administer sodium chloride.  Meds ordered this encounter  Medications   pantoprazole (PROTONIX) 40 MG tablet    Sig: Take 1 tablet (40 mg total) by mouth daily.    Dispense:  90 tablet    Refill:  1    Order Specific Question:   Supervising Provider    Answer:   Danise Edge A [[id number]]   amitriptyline (ELAVIL) 25 MG tablet    Sig: TAKE 1 TABLET BY MOUTH AT BEDTIME .    Dispense:  90 tablet    Refill:  1    Order Specific Question:   Supervising Provider    Answer:   Danise Edge A [[id number]]   cyanocobalamin (VITAMIN B12) injection 1,000 mcg

## 2023-04-23 ENCOUNTER — Ambulatory Visit (INDEPENDENT_AMBULATORY_CARE_PROVIDER_SITE_OTHER): Payer: BC Managed Care – PPO | Admitting: *Deleted

## 2023-04-23 DIAGNOSIS — E538 Deficiency of other specified B group vitamins: Secondary | ICD-10-CM | POA: Diagnosis not present

## 2023-04-23 MED ORDER — CYANOCOBALAMIN 1000 MCG/ML IJ SOLN
1000.0000 ug | Freq: Once | INTRAMUSCULAR | Status: AC
  Administered 2023-04-23: 1000 ug via INTRAMUSCULAR

## 2023-04-23 NOTE — Progress Notes (Signed)
Pt in for 2 week B12 injection per PCP order on 04/02/23.  B12 given RD without complication.  Pt already has appt for 7/23.

## 2023-05-04 ENCOUNTER — Ambulatory Visit (INDEPENDENT_AMBULATORY_CARE_PROVIDER_SITE_OTHER): Payer: BC Managed Care – PPO

## 2023-05-04 DIAGNOSIS — E538 Deficiency of other specified B group vitamins: Secondary | ICD-10-CM

## 2023-05-04 MED ORDER — CYANOCOBALAMIN 1000 MCG/ML IJ SOLN
1000.0000 ug | Freq: Once | INTRAMUSCULAR | Status: AC
Start: 2023-05-04 — End: 2023-05-04
  Administered 2023-05-04: 1000 ug via INTRAMUSCULAR

## 2023-05-04 NOTE — Progress Notes (Signed)
Tiffany Lyons is a 37 y.o. female presents to the office today for Bi weekly B12 injection per physician's orders. Original order: 04/01/23 Patient notified of results and scheduled to get b12 in 2 weeks (clarification received via Sima Matas, CMA verbally) Cyanocobalamin 1000 mg/ml IM was administered L deltoid today. Patient tolerated injection. Patient due for follow up labs/provider appt: No. Date due: 3 months, appt made apt already pending Patient next injection due: 2 weeks, appt made Yes  Creft, Melton Alar L

## 2023-05-18 ENCOUNTER — Other Ambulatory Visit: Payer: BC Managed Care – PPO

## 2023-05-18 ENCOUNTER — Telehealth: Payer: Self-pay | Admitting: Family

## 2023-05-18 ENCOUNTER — Ambulatory Visit: Payer: BC Managed Care – PPO

## 2023-05-18 NOTE — Telephone Encounter (Signed)
Patient received a call to r/s her b12 shot due to nurse not being available. Pt has lab appt also. Pt is going out of town early on Thursday and wanted to know if she could come in tomorrow for both but no nurse spots available. Pt is concerned that if she waits until she is back which would be the week of 8/19, it'll have been a month since her last shot as opposed to biweekly which may throw the lab results off. Pt would like a call back to see if we are able to do her shot tomorrow. Please advise

## 2023-05-18 NOTE — Telephone Encounter (Signed)
Patient scheduled for tomorrow to have b12 labs check and nv shot after.

## 2023-05-19 ENCOUNTER — Other Ambulatory Visit (INDEPENDENT_AMBULATORY_CARE_PROVIDER_SITE_OTHER): Payer: BC Managed Care – PPO

## 2023-05-19 ENCOUNTER — Ambulatory Visit (INDEPENDENT_AMBULATORY_CARE_PROVIDER_SITE_OTHER): Payer: BC Managed Care – PPO

## 2023-05-19 DIAGNOSIS — E538 Deficiency of other specified B group vitamins: Secondary | ICD-10-CM

## 2023-05-19 MED ORDER — CYANOCOBALAMIN 1000 MCG/ML IJ SOLN
1000.0000 ug | Freq: Once | INTRAMUSCULAR | Status: AC
Start: 2023-05-19 — End: 2023-05-19
  Administered 2023-05-19: 1000 ug via INTRAMUSCULAR

## 2023-05-19 NOTE — Progress Notes (Signed)
Pt here for monthly B12 injection per Melissa  B12 given IM, and pt tolerated injection well.  Next B12 injection has been postponed. Pt had lab work today to check B12 levels

## 2023-05-23 ENCOUNTER — Telehealth: Payer: Self-pay | Admitting: Family

## 2023-05-23 DIAGNOSIS — E538 Deficiency of other specified B group vitamins: Secondary | ICD-10-CM | POA: Insufficient documentation

## 2023-05-23 MED ORDER — FOLIC ACID 1 MG PO TABS: 1.0000 mg | ORAL_TABLET | Freq: Every day | ORAL | Status: AC

## 2023-05-23 MED ORDER — VITAMIN B-12 1000 MCG PO TABS: 1000.0000 ug | ORAL_TABLET | Freq: Every day | ORAL | Status: AC

## 2023-05-23 NOTE — Telephone Encounter (Signed)
Please advise pt that her b12 is still pretty low even though she has been taking b12 shots every 2 weeks.  I would like her to continue the shots every 2 weeks but also add oral b12 PO daily.   Also, folate is low, please add otc folic acid 1mg  PO daily.   Let' repeat b12 and folate in 6 weeks, dx b12 deficiency.

## 2023-05-23 NOTE — Addendum Note (Signed)
Addended by: Sandford Craze on: 05/23/2023 04:42 PM   Modules accepted: Orders

## 2023-05-24 NOTE — Addendum Note (Signed)
Addended by: Wilford Corner on: 05/24/2023 02:18 PM   Modules accepted: Orders

## 2023-05-24 NOTE — Telephone Encounter (Signed)
Pt called back to go over results. All CMAs were unavailable at the time and advised a note would be sent back to give her a call back.

## 2023-05-24 NOTE — Telephone Encounter (Signed)
Patient notified of results, new supplements and she will call back to schedule her next B12 injection and lab appointment for 6 weeks.

## 2023-05-24 NOTE — Telephone Encounter (Signed)
Called patient but no answer, left voice mail for patient to call back.   

## 2023-07-16 ENCOUNTER — Ambulatory Visit (INDEPENDENT_AMBULATORY_CARE_PROVIDER_SITE_OTHER): Payer: BC Managed Care – PPO | Admitting: Family

## 2023-07-16 ENCOUNTER — Encounter: Payer: Self-pay | Admitting: Family

## 2023-07-16 VITALS — BP 125/79 | HR 92 | Temp 98.6°F | Resp 16 | Ht 65.0 in | Wt 181.0 lb

## 2023-07-16 DIAGNOSIS — G4700 Insomnia, unspecified: Secondary | ICD-10-CM | POA: Diagnosis not present

## 2023-07-16 DIAGNOSIS — Z0000 Encounter for general adult medical examination without abnormal findings: Secondary | ICD-10-CM | POA: Diagnosis not present

## 2023-07-16 DIAGNOSIS — Z23 Encounter for immunization: Secondary | ICD-10-CM

## 2023-07-16 DIAGNOSIS — K219 Gastro-esophageal reflux disease without esophagitis: Secondary | ICD-10-CM | POA: Diagnosis not present

## 2023-07-16 DIAGNOSIS — E538 Deficiency of other specified B group vitamins: Secondary | ICD-10-CM | POA: Diagnosis not present

## 2023-07-16 DIAGNOSIS — D519 Vitamin B12 deficiency anemia, unspecified: Secondary | ICD-10-CM | POA: Insufficient documentation

## 2023-07-16 LAB — CBC WITH DIFFERENTIAL/PLATELET
Basophils Absolute: 0 10*3/uL (ref 0.0–0.1)
Basophils Relative: 0.6 % (ref 0.0–3.0)
Eosinophils Absolute: 0.2 10*3/uL (ref 0.0–0.7)
Eosinophils Relative: 2.8 % (ref 0.0–5.0)
HCT: 35.5 % — ABNORMAL LOW (ref 36.0–46.0)
Hemoglobin: 11.6 g/dL — ABNORMAL LOW (ref 12.0–15.0)
Lymphocytes Relative: 37.8 % (ref 12.0–46.0)
Lymphs Abs: 2.7 10*3/uL (ref 0.7–4.0)
MCHC: 32.6 g/dL (ref 30.0–36.0)
MCV: 90.2 fL (ref 78.0–100.0)
Monocytes Absolute: 0.5 10*3/uL (ref 0.1–1.0)
Monocytes Relative: 6.5 % (ref 3.0–12.0)
Neutro Abs: 3.8 10*3/uL (ref 1.4–7.7)
Neutrophils Relative %: 52.3 % (ref 43.0–77.0)
Platelets: 301 10*3/uL (ref 150.0–400.0)
RBC: 3.93 Mil/uL (ref 3.87–5.11)
RDW: 13.4 % (ref 11.5–15.5)
WBC: 7.3 10*3/uL (ref 4.0–10.5)

## 2023-07-16 LAB — B12 AND FOLATE PANEL
Folate: 19.8 ng/mL (ref >5.9)
Vitamin B-12: 383 pg/mL (ref 211–911)

## 2023-07-16 MED ORDER — NORETHIN ACE-ETH ESTRAD-FE 1.5-30 MG-MCG PO TABS: 1.0000 | ORAL_TABLET | Freq: Every day | ORAL | 4 refills | Status: DC

## 2023-07-16 MED ORDER — AMITRIPTYLINE HCL 50 MG PO TABS
50.0000 mg | ORAL_TABLET | Freq: Every day | ORAL | 1 refills | Status: DC
Start: 2023-07-16 — End: 2024-05-16

## 2023-07-16 NOTE — Assessment & Plan Note (Signed)
Stable with pantoprazole, does not tolerated discontinuation.

## 2023-07-16 NOTE — Assessment & Plan Note (Signed)
On oral folic acid. Update level.

## 2023-07-16 NOTE — Assessment & Plan Note (Signed)
Uncontrolled increase elavil from 25 mg to 50mg .

## 2023-07-16 NOTE — Patient Instructions (Signed)
VISIT SUMMARY:  During your visit, we discussed your insomnia, vitamin B12 deficiency, gastroesophageal reflux disease (GERD), general health maintenance, family stress, and diet and exercise. We also planned for some follow-up actions.  YOUR PLAN:  -INSOMNIA: Insomnia is a sleep disorder that makes it hard for you to fall asleep, stay asleep, or both. We will increase your Amitriptyline dosage to 50mg  at bedtime. Also, try to avoid screens like tablets and phones for 1.5 hours before bedtime.  -VITAMIN B12 DEFICIENCY: Vitamin B12 deficiency means your body does not have enough of this vitamin. We will check your intrinsic factor, B12, and folate levels today. If they are low, we will schedule a nurse visit to teach you how to self-administer B12 injections.  -GASTROESOPHAGEAL REFLUX DISEASE (GERD): GERD is a chronic condition where stomach acid frequently flows back into the tube connecting your mouth and stomach. Continue taking Pantoprazole daily to manage this.  -GENERAL HEALTH MAINTENANCE: We administered the influenza vaccine today. Please get an updated COVID-19 booster at a pharmacy. Try to incorporate 30 minutes of walking 5 days a week. Also, schedule dental and vision check-ups. Continue taking your birth control as prescribed. We will check your blood count for anemia.  -FAMILY STRESS: Stress can have a significant impact on your health. We are here to offer support and are available for discussion if needed.  -DIET AND EXERCISE: A balanced diet and regular exercise are important for your overall health. Try to include chicken, fish, and [place] in your diet. Also, aim for 30 minutes of walking 5 days a week.  INSTRUCTIONS:  We will check your labs today and follow up on the results. We will also refill your birth control prescription. Please come in for a follow-up visit after your lab results are available.

## 2023-07-16 NOTE — Assessment & Plan Note (Signed)
Did not feel well with injections- now on oral supplement. Will check follow up levels as well as intrinsic factor. If low, she is interested in learning how to self administer b12 injections.

## 2023-07-16 NOTE — Progress Notes (Addendum)
Subjective:     Patient ID: Tiffany Lyons, female    DOB: August 22, 1986, 37 y.o.   MRN: [id number]  Chief Complaint  Patient presents with   Annual Exam    HPI  Discussed the use of AI scribe software for clinical note transcription with the patient, who gave verbal consent to proceed.  History of Present Illness      The patient presents today for her annual physical.  She has a history of insomnia previously managed well with amitriptyline, reports a recent decrease in sleep duration to approximately three hours per night. She attributes this to the return of their children and work-related stress. She typically goes to bed around 10 PM and lays awake for most of the night. Her caffeine consumption is minimal, usually one serving in the morning. She uses electronic devices shortly before bedtime. The amitriptyline had been effective for several years, but its efficacy has recently decreased.  The patient also reports daily use of pantoprazole for gastroesophageal reflux disease (GERD), which she feels is necessary. She has tried to stop taking it in the past, but symptoms return within a week.  She has been adherent to her daily vitamin B12 and folate supplementation. She was previously receiving B12 injections but stopped due to scheduling difficulties.  The patient also mentions a family member who recently experienced sudden hearing loss in one ear.  She reports a healthy diet, primarily vegetarian, with occasional consumption of red meat. She does not engage in formal exercise but is active at work. She has a history of mild anemia.  The patient has been avoiding dental care due to anxiety, despite having dental insurance. Her last dental visit was approximately two years ago.  She is currently taking birth control and reports no issues with her menstrual cycle.    Immunizations: flu shot today Diet: Could be better- trying to eat more meat Wt Readings from Last 3 Encounters:   07/16/23 181 lb (82.1 kg)  04/02/23 177 lb (80.3 kg)  10/02/22 175 lb (79.4 kg)  Exercise: no formal exercise Pap Smear: due 2027 Mammogram: start at 40 Vision: due Dental: due       Health Maintenance Due  Topic Date Due   COVID-19 Vaccine (3 - Pfizer risk series) 01/29/2020   INFLUENZA VACCINE  05/13/2023    Past Medical History:  Diagnosis Date   Depression    Family history of colon cancer    Gallstones    Genetic testing 11/25/2016   Test Results: No pathogenic mutations detected.  Genes Analyzed: 43 genes on [facility]'s Common Cancers panel (APC, ATM, AXIN2, BARD1, BMPR1A, BRCA1, BRCA2, BRIP1, CDH1, CDKN2A, CHEK2, DICER1, EPCAM, GREM1, HOXB13, KIT, MEN1, MLH1, MSH2, MSH6, MUTYH, NBN, NF1, PALB2, PDGFRA, PMS2, POLD1, POLE, PTEN, RAD50, RAD51C, RAD51D, SDHA, SDHB, SDHC, SDHD, SMAD4, SMARCA4, STK11, TP53, TSC1, TSC2, VHL).   History of kidney stones    Vitamin B12 deficiency     Past Surgical History:  Procedure Laterality Date   CHOLECYSTECTOMY  10/17/12   lap chole   COLONOSCOPY  02/2017   will need repeat every 5 yrs per pt.   WISDOM TOOTH EXTRACTION      Family History  Problem Relation Age of Onset   Arthritis Mother    Hemophilia Mother    Irritable bowel syndrome Mother    Colon cancer Father        first diagnosed at age 23, symptoms at 4   Alcohol abuse Father    Alcohol abuse  Brother    Drug abuse Brother    Acute myelogenous leukemia Maternal Grandfather    Diabetes Paternal Grandfather    Colon cancer Paternal Grandfather        Dx 26s; currently 72   Heart disease Paternal Grandfather    Hearing loss Paternal Grandfather        sudden hearing loss on e ear   Scoliosis Half-Sister    Colon cancer Other        pat grandfather's mother   Cancer Other        several siblings of paternal grandfather; unk. types of cancer    Social History   Socioeconomic History   Marital status: Married    Spouse name: Not on file   Number of children: 1    Years of education: Not on file   Highest education level: 12th grade  Occupational History   Occupation: custodian, bus driver  Tobacco Use   Smoking status: Former    Current packs/day: 0.00    Types: Cigarettes    Quit date: 07/31/2013    Years since quitting: 9.9   Smokeless tobacco: Never   Tobacco comments:    Using vapor cigarettes.  Vaping Use   Vaping status: Some Days  Substance and Sexual Activity   Alcohol use: Yes    Alcohol/week: 3.0 standard drinks of alcohol    Types: 3 Standard drinks or equivalent per week   Drug use: No   Sexual activity: Yes    Partners: Male  Other Topics Concern   Not on file  Social History Narrative   Works for [employer]      Patient is Married      She has a son - born 2008      She completed HS- enjoys reading, crocheting            Social Determinants of Health   Financial Resource Strain: Low Risk  (04/01/2023)   Overall Financial Resource Strain (CARDIA)    Difficulty of Paying Living Expenses: Not very hard  Food Insecurity: No Food Insecurity (04/01/2023)   Hunger Vital Sign    Worried About Running Out of Food in the Last Year: Never true    Ran Out of Food in the Last Year: Never true  Transportation Needs: No Transportation Needs (04/01/2023)   PRAPARE - Administrator, Civil Service (Medical): No    Lack of Transportation (Non-Medical): No  Physical Activity: Insufficiently Active (04/01/2023)   Exercise Vital Sign    Days of Exercise per Week: 4 days    Minutes of Exercise per Session: 20 min  Stress: Stress Concern Present (04/01/2023)   [employer] of Occupational Health - Occupational Stress Questionnaire    Feeling of Stress : To some extent  Social Connections: Moderately Isolated (04/01/2023)   Social Connection and Isolation Panel [NHANES]    Frequency of Communication with Friends and Family: More than three times a week    Frequency of Social Gatherings with  Friends and Family: Once a week    Attends Religious Services: Never    Database administrator or Organizations: No    Attends Engineer, structural: Not on file    Marital Status: Married  Catering manager Violence: Not on file    Outpatient Medications Prior to Visit  Medication Sig Dispense Refill   cyanocobalamin (VITAMIN B12) 1000 MCG tablet Take 1 tablet (1,000 mcg total) by mouth daily.     folic  acid (FOLVITE) 1 MG tablet Take 1 tablet (1 mg total) by mouth daily.     Multiple Vitamins-Minerals (MULTIVITAMIN WITH MINERALS) tablet Take 1 tablet by mouth daily.     pantoprazole (PROTONIX) 40 MG tablet Take 1 tablet (40 mg total) by mouth daily. 90 tablet 1   amitriptyline (ELAVIL) 25 MG tablet TAKE 1 TABLET BY MOUTH AT BEDTIME . 90 tablet 1   EQ ALLERGY RELIEF 10 MG tablet Take 1 tablet by mouth once daily 30 tablet 0   norethindrone-ethinyl estradiol-iron (LARIN FE 1.5/30) 1.5-30 MG-MCG tablet Take 1 tablet by mouth daily. 84 tablet 3   0.9 %  sodium chloride infusion      No facility-administered medications prior to visit.    No Known Allergies  ROS    See HPI Objective:    Physical Exam   BP 125/79 (BP Location: Right Arm, Patient Position: Sitting, Cuff Size: Small)   Pulse 92   Temp 98.6 F (37 C) (Oral)   Resp 16   Ht 5\' 5"  (1.651 m)   Wt 181 lb (82.1 kg)   SpO2 100%   BMI 30.12 kg/m  Wt Readings from Last 3 Encounters:  07/16/23 181 lb (82.1 kg)  04/02/23 177 lb (80.3 kg)  10/02/22 175 lb (79.4 kg)  Physical Exam  Constitutional: She is oriented to person, place, and time. She appears well-developed and well-nourished. No distress.  HENT:  Head: Normocephalic and atraumatic.  Right Ear: Tympanic membrane and ear canal normal.  Left Ear: Tympanic membrane and ear canal normal.  Mouth/Throat: Oropharynx is clear and moist.  Eyes: Pupils are equal, round, and reactive to light. No scleral icterus.  Neck: Normal range of motion. No thyromegaly  present.  Cardiovascular: Normal rate and regular rhythm.   No murmur heard. Pulmonary/Chest: Effort normal and breath sounds normal. No respiratory distress. He has no wheezes. She has no rales. She exhibits no tenderness.  Abdominal: Soft. Bowel sounds are normal. She exhibits no distension and no mass. There is no tenderness. There is no rebound and no guarding.  Musculoskeletal: She exhibits no edema.  Lymphadenopathy:    She has no cervical adenopathy.  Neurological: She is alert and oriented to person, place, and time. She has normal patellar reflexes. She exhibits normal muscle tone. Coordination normal.  Skin: Skin is warm and dry.  Psychiatric: She has a normal mood and affect. Her behavior is normal. Judgment and thought content normal.  Breast/Pelvic: deferred     Assessment & Plan:        Assessment & Plan:   Problem List Items Addressed This Visit       Unprioritized   Routine general medical examination at a health care facility - Primary    Continue healthy diet, exercise and weight loss efforts.  Flu shot today. Recommend covid booster at the pharmacy. Pap up to date.  Recommend that she schedule vision and dental exams.      Insomnia    Uncontrolled increase elavil from 25 mg to 50mg .       Relevant Medications   amitriptyline (ELAVIL) 50 MG tablet   Gastroesophageal reflux disease    Stable with pantoprazole, does not tolerated discontinuation.       Folate deficiency    On oral folic acid. Update level.       Relevant Orders   B12 and Folate Panel   B12 deficiency    Did not feel well with injections- now on oral supplement. Will check  follow up levels as well as intrinsic factor. If low, she is interested in learning how to self administer b12 injections.       Relevant Orders   Intrinsic Factor Antibodies   Anemia due to vitamin B12 deficiency   Relevant Orders   CBC w/Diff    I have discontinued [facility] Fresh "Jordin Bennison"'s EQ Allergy  Relief and amitriptyline. I am also having her start on amitriptyline. Additionally, I am having her maintain her multivitamin with minerals, pantoprazole, cyanocobalamin, folic acid, and norethindrone-ethinyl estradiol-iron. We will stop administering sodium chloride.  Meds ordered this encounter  Medications   amitriptyline (ELAVIL) 50 MG tablet    Sig: Take 1 tablet (50 mg total) by mouth at bedtime.    Dispense:  90 tablet    Refill:  1    Order Specific Question:   Supervising Provider    Answer:   Danise Edge A [[id number]]   norethindrone-ethinyl estradiol-iron (LARIN FE 1.5/30) 1.5-30 MG-MCG tablet    Sig: Take 1 tablet by mouth daily.    Dispense:  84 tablet    Refill:  4    Order Specific Question:   Supervising Provider    Answer:   Danise Edge A [[id number]]

## 2023-07-16 NOTE — Assessment & Plan Note (Signed)
Continue healthy diet, exercise and weight loss efforts.  Flu shot today. Recommend covid booster at the pharmacy. Pap up to date.  Recommend that she schedule vision and dental exams.

## 2023-07-16 NOTE — Addendum Note (Signed)
Addended by: Wilford Corner on: 07/16/2023 10:29 AM   Modules accepted: Orders

## 2023-07-20 LAB — INTRINSIC FACTOR ANTIBODIES: Intrinsic Factor: NEGATIVE

## 2023-07-21 ENCOUNTER — Telehealth: Payer: Self-pay | Admitting: Family

## 2023-07-21 NOTE — Telephone Encounter (Signed)
See mychart.  

## 2023-08-17 ENCOUNTER — Ambulatory Visit: Payer: BC Managed Care – PPO | Admitting: Family

## 2023-08-17 VITALS — BP 135/95 | HR 95 | Temp 99.2°F | Resp 16 | Ht 65.0 in | Wt 187.0 lb

## 2023-08-17 DIAGNOSIS — M25561 Pain in right knee: Secondary | ICD-10-CM | POA: Diagnosis not present

## 2023-08-17 DIAGNOSIS — R030 Elevated blood-pressure reading, without diagnosis of hypertension: Secondary | ICD-10-CM | POA: Diagnosis not present

## 2023-08-17 NOTE — Assessment & Plan Note (Signed)
Suspect due to pain. Recommended that she return in 1 month for a nurse visit bp recheck.

## 2023-08-17 NOTE — Assessment & Plan Note (Signed)
New.  Not improving with diclofenac.  Suspect meniscal tear.  Refer to ortho. For pain can continue tylenol and try diclofenac gel topically rather than PO.

## 2023-08-17 NOTE — Patient Instructions (Signed)
VISIT SUMMARY:  You came in today because of worsening knee pain that started two weeks ago. You mentioned feeling a 'pop' in your knee while going down the stairs, followed by severe pain. Since then, the pain has become constant and is no longer limited to movement. You also reported significant swelling in your knee.  YOUR PLAN:  -KNEE PAIN: You have acute worsening of chronic knee pain, and we suspect a meniscal tear. A meniscal tear is a common knee injury where the cartilage in your knee is damaged. We are referring you to an orthopedic specialist for further evaluation and possibly an MRI. In the meantime, continue using over-the-counter topical diclofenac and add Tylenol to help manage the pain.  -HYPERTENSION: Your blood pressure readings were elevated today, likely due to the pain in your knee. Hypertension is high blood pressure, which can be influenced by pain and stress. We will schedule a nurse visit in one month to check your blood pressure again and ensure it has returned to normal.  INSTRUCTIONS:  Please follow up with the orthopedic specialist as soon as possible for your knee evaluation. Additionally, schedule a nurse visit in one month for a blood pressure check.

## 2023-08-17 NOTE — Progress Notes (Signed)
Subjective:     Patient ID: Tiffany Lyons, female    DOB: 12/24/1985, 37 y.o.   MRN: [id number]  Chief Complaint  Patient presents with   Knee Pain    Patient reports right knee pain, was seen at [facility].     Knee Pain     Discussed the use of AI scribe software for clinical note transcription with the patient, who gave verbal consent to proceed.  History of Present Illness    The patient presents with a two-week history of intermittent knee pain that has recently worsened. She describes an incident where she felt a 'pop' in her knee while descending stairs, followed by severe pain. Since then, the pain has become constant and is no longer limited to movement such as ascending or descending stairs or standing from a seated position. The pain is described as a constant dull ache under the kneecap and up the inner side of the knee, with intermittent shooting pains that radiate up the leg. The patient also reports periods of significant swelling to the point where the kneecap is not discernible.     BP 135/88 yesterday BP Readings from Last 3 Encounters:  08/17/23 (!) 135/95  07/16/23 125/79  04/02/23 121/72       Health Maintenance Due  Topic Date Due   COVID-19 Vaccine (3 - Pfizer risk series) 01/29/2020    Past Medical History:  Diagnosis Date   Depression    Family history of colon cancer    Gallstones    Genetic testing 11/25/2016   Test Results: No pathogenic mutations detected.  Genes Analyzed: 43 genes on [facility]'s Common Cancers panel (APC, ATM, AXIN2, BARD1, BMPR1A, BRCA1, BRCA2, BRIP1, CDH1, CDKN2A, CHEK2, DICER1, EPCAM, GREM1, HOXB13, KIT, MEN1, MLH1, MSH2, MSH6, MUTYH, NBN, NF1, PALB2, PDGFRA, PMS2, POLD1, POLE, PTEN, RAD50, RAD51C, RAD51D, SDHA, SDHB, SDHC, SDHD, SMAD4, SMARCA4, STK11, TP53, TSC1, TSC2, VHL).   History of kidney stones    Vitamin B12 deficiency     Past Surgical History:  Procedure Laterality Date   CHOLECYSTECTOMY  10/17/12   lap chole    COLONOSCOPY  02/2017   will need repeat every 5 yrs per pt.   WISDOM TOOTH EXTRACTION      Family History  Problem Relation Age of Onset   Arthritis Mother    Hemophilia Mother    Irritable bowel syndrome Mother    Colon cancer Father        first diagnosed at age 79, symptoms at 36   Alcohol abuse Father    Alcohol abuse Brother    Drug abuse Brother    Acute myelogenous leukemia Maternal Grandfather    Diabetes Paternal Grandfather    Colon cancer Paternal Grandfather        Dx 59s; currently 70   Heart disease Paternal Grandfather    Hearing loss Paternal Grandfather        sudden hearing loss on e ear   Scoliosis Half-Sister    Colon cancer Other        pat grandfather's mother   Cancer Other        several siblings of paternal grandfather; unk. types of cancer    Social History   Socioeconomic History   Marital status: Married    Spouse name: Not on file   Number of children: 1   Years of education: Not on file   Highest education level: 12th grade  Occupational History   Occupation: custodian, bus driver  Tobacco Use  Smoking status: Former    Current packs/day: 0.00    Types: Cigarettes    Quit date: 07/31/2013    Years since quitting: 10.0   Smokeless tobacco: Never   Tobacco comments:    Using vapor cigarettes.  Vaping Use   Vaping status: Some Days  Substance and Sexual Activity   Alcohol use: Yes    Alcohol/week: 3.0 standard drinks of alcohol    Types: 3 Standard drinks or equivalent per week   Drug use: No   Sexual activity: Yes    Partners: Male  Other Topics Concern   Not on file  Social History Narrative   Works for [employer]      Patient is Married      She has a son - born 2008      She completed HS- enjoys reading, crocheting            Social Determinants of Health   Financial Resource Strain: Low Risk  (08/16/2023)   Overall Financial Resource Strain (CARDIA)    Difficulty of Paying Living Expenses:  Not very hard  Food Insecurity: No Food Insecurity (08/16/2023)   Hunger Vital Sign    Worried About Running Out of Food in the Last Year: Never true    Ran Out of Food in the Last Year: Never true  Transportation Needs: No Transportation Needs (08/16/2023)   PRAPARE - Administrator, Civil Service (Medical): No    Lack of Transportation (Non-Medical): No  Physical Activity: Inactive (08/16/2023)   Exercise Vital Sign    Days of Exercise per Week: 0 days    Minutes of Exercise per Session: 20 min  Stress: No Stress Concern Present (08/16/2023)   [employer] of Occupational Health - Occupational Stress Questionnaire    Feeling of Stress : Only a little  Social Connections: Moderately Isolated (08/16/2023)   Social Connection and Isolation Panel [NHANES]    Frequency of Communication with Friends and Family: Three times a week    Frequency of Social Gatherings with Friends and Family: Once a week    Attends Religious Services: Never    Database administrator or Organizations: No    Attends Engineer, structural: Not on file    Marital Status: Married  Catering manager Violence: Not on file    Outpatient Medications Prior to Visit  Medication Sig Dispense Refill   amitriptyline (ELAVIL) 50 MG tablet Take 1 tablet (50 mg total) by mouth at bedtime. 90 tablet 1   cyanocobalamin (VITAMIN B12) 1000 MCG tablet Take 1 tablet (1,000 mcg total) by mouth daily.     folic acid (FOLVITE) 1 MG tablet Take 1 tablet (1 mg total) by mouth daily.     Multiple Vitamins-Minerals (MULTIVITAMIN WITH MINERALS) tablet Take 1 tablet by mouth daily.     norethindrone-ethinyl estradiol-iron (LARIN FE 1.5/30) 1.5-30 MG-MCG tablet Take 1 tablet by mouth daily. 84 tablet 4   pantoprazole (PROTONIX) 40 MG tablet Take 1 tablet (40 mg total) by mouth daily. 90 tablet 1   No facility-administered medications prior to visit.    No Known Allergies  ROS See HPI    Objective:    Physical  Exam Constitutional:      Appearance: Normal appearance.  Pulmonary:     Effort: Pulmonary effort is normal.  Musculoskeletal:     Comments: Right knee, no obvious swelling today.  + tenderness to palpation below patella medially and left lateral  Neurological:  Mental Status: She is alert.      BP (!) 135/95   Pulse 95   Temp 99.2 F (37.3 C) (Oral)   Resp 16   Ht 5\' 5"  (1.651 m)   Wt 187 lb (84.8 kg)   SpO2 100%   BMI 31.12 kg/m  Wt Readings from Last 3 Encounters:  08/17/23 187 lb (84.8 kg)  07/16/23 181 lb (82.1 kg)  04/02/23 177 lb (80.3 kg)       Assessment & Plan:   Problem List Items Addressed This Visit       Unprioritized   Elevated blood pressure reading    Suspect due to pain. Recommended that she return in 1 month for a nurse visit bp recheck.       Acute pain of right knee - Primary    New.  Not improving with diclofenac.  Suspect meniscal tear.  Refer to ortho. For pain can continue tylenol and try diclofenac gel topically rather than PO.       Relevant Orders   Ambulatory referral to Orthopedic Surgery    I am having Alyla Medaglia "[facility]" maintain her multivitamin with minerals, pantoprazole, cyanocobalamin, folic acid, amitriptyline, and norethindrone-ethinyl estradiol-iron.  No orders of the defined types were placed in this encounter.

## 2023-08-25 ENCOUNTER — Other Ambulatory Visit (INDEPENDENT_AMBULATORY_CARE_PROVIDER_SITE_OTHER): Payer: BC Managed Care – PPO

## 2023-08-25 ENCOUNTER — Encounter: Payer: Self-pay | Admitting: Orthopaedic Surgery

## 2023-08-25 ENCOUNTER — Ambulatory Visit: Payer: BC Managed Care – PPO | Admitting: Orthopaedic Surgery

## 2023-08-25 DIAGNOSIS — M25561 Pain in right knee: Secondary | ICD-10-CM

## 2023-08-25 MED ORDER — LIDOCAINE HCL 1 % IJ SOLN
2.0000 mL | INTRAMUSCULAR | Status: AC | PRN
Start: 2023-08-25 — End: 2023-08-25
  Administered 2023-08-25: 2 mL

## 2023-08-25 MED ORDER — BUPIVACAINE HCL 0.5 % IJ SOLN
2.0000 mL | INTRAMUSCULAR | Status: AC | PRN
Start: 2023-08-25 — End: 2023-08-25
  Administered 2023-08-25: 2 mL via INTRA_ARTICULAR

## 2023-08-25 MED ORDER — METHYLPREDNISOLONE ACETATE 40 MG/ML IJ SUSP
40.0000 mg | INTRAMUSCULAR | Status: AC | PRN
Start: 2023-08-25 — End: 2023-08-25
  Administered 2023-08-25: 40 mg via INTRA_ARTICULAR

## 2023-08-25 NOTE — Progress Notes (Signed)
Office Visit Note   Patient: Tiffany Lyons           Date of Birth: November 19, 1985           MRN: [id number] Visit Date: 08/25/2023              Requested by: Sandford Craze, NP [address] [place],  [place] [address] PCP: Sandford Craze, NP   Assessment & Plan: Visit Diagnoses:  1. Acute pain of right knee     Plan: Impression is right knee questionable medial meniscus tear without mechanical symptoms.  Today, we discussed various treatment options to include cortisone injection versus MRI.  She currently does not have any mechanical symptoms.  She would like to proceed with cortisone injection today.  Follow-up if symptoms do not improve in 6 weeks.  Follow-Up Instructions: Return if symptoms worsen or fail to improve.   Orders:  Orders Placed This Encounter  Procedures   XR KNEE 3 VIEW RIGHT   No orders of the defined types were placed in this encounter.     Procedures: Large Joint Inj: R knee on 08/25/2023 4:06 PM Indications: pain Details: 22 G needle  Arthrogram: No  Medications: 40 mg methylPREDNISolone acetate 40 MG/ML; 2 mL lidocaine 1 %; 2 mL bupivacaine 0.5 % Consent was given by the patient. Patient was prepped and draped in the usual sterile fashion.       Clinical Data: No additional findings.   Subjective: Chief Complaint  Patient presents with   Right Knee - Pain    HPI patient is a pleasant 37 year old female who comes in today with right knee pain.  Approximately 3 weeks ago, she was descending stairs and felt a pop to the anterior medial knee.  She has had a fair amount of pain since.  Symptoms were initially intermittent but have become more constant.  Pain is worse going from a seated to standing position as well as with ascending and descending stairs/hills.  She has been taking Tylenol and using topical Voltaren without significant relief.  Review of Systems as detailed in HPI.  All others reviewed and are  negative.   Objective: Vital Signs: There were no vitals taken for this visit.  Physical Exam well-developed well-nourished female no acute distress.  Alert and oriented x 3.  Ortho Exam examination of the right knee shows a trace effusion.  Range of motion 5 to 110 degrees.  Medial joint line tenderness.  She does have tenderness along the medial patella facet.  No patellar apprehension.  Ligaments are stable.  Neurovascular intact distally.  Specialty Comments:  No specialty comments available.  Imaging: XR KNEE 3 VIEW RIGHT  Result Date: 08/25/2023 No acute or structural abnormalities.  She does have decreased joint space in all 3 compartments    PMFS History: Patient Active Problem List   Diagnosis Date Noted   Acute pain of right knee 08/17/2023   Elevated blood pressure reading 08/17/2023   Anemia due to vitamin B12 deficiency 07/16/2023   B12 deficiency 07/16/2023   Folate deficiency 05/23/2023   Irregular menses 10/02/2022   Gastroesophageal reflux disease 03/27/2022   Insomnia 03/27/2022   Pelvic pain 01/01/2021   Genetic testing 11/25/2016   Family history of colon cancer    Iron deficiency 08/02/2014   Routine general medical examination at a health care facility 07/17/2013   Depression 09/28/2012   Past Medical History:  Diagnosis Date   Depression    Family history of  colon cancer    Gallstones    Genetic testing 11/25/2016   Test Results: No pathogenic mutations detected.  Genes Analyzed: 43 genes on [facility]'s Common Cancers panel (APC, ATM, AXIN2, BARD1, BMPR1A, BRCA1, BRCA2, BRIP1, CDH1, CDKN2A, CHEK2, DICER1, EPCAM, GREM1, HOXB13, KIT, MEN1, MLH1, MSH2, MSH6, MUTYH, NBN, NF1, PALB2, PDGFRA, PMS2, POLD1, POLE, PTEN, RAD50, RAD51C, RAD51D, SDHA, SDHB, SDHC, SDHD, SMAD4, SMARCA4, STK11, TP53, TSC1, TSC2, VHL).   History of kidney stones    Vitamin B12 deficiency     Family History  Problem Relation Age of Onset   Arthritis Mother    Hemophilia Mother     Irritable bowel syndrome Mother    Colon cancer Father        first diagnosed at age 47, symptoms at 45   Alcohol abuse Father    Alcohol abuse Brother    Drug abuse Brother    Acute myelogenous leukemia Maternal Grandfather    Diabetes Paternal Grandfather    Colon cancer Paternal Grandfather        Dx 45s; currently 72   Heart disease Paternal Grandfather    Hearing loss Paternal Grandfather        sudden hearing loss on e ear   Scoliosis Half-Sister    Colon cancer Other        pat grandfather's mother   Cancer Other        several siblings of paternal grandfather; unk. types of cancer    Past Surgical History:  Procedure Laterality Date   CHOLECYSTECTOMY  10/17/12   lap chole   COLONOSCOPY  02/2017   will need repeat every 5 yrs per pt.   WISDOM TOOTH EXTRACTION     Social History   Occupational History   Occupation: custodian, bus driver  Tobacco Use   Smoking status: Former    Current packs/day: 0.00    Types: Cigarettes    Quit date: 07/31/2013    Years since quitting: 10.0   Smokeless tobacco: Never   Tobacco comments:    Using vapor cigarettes.  Vaping Use   Vaping status: Some Days  Substance and Sexual Activity   Alcohol use: Yes    Alcohol/week: 3.0 standard drinks of alcohol    Types: 3 Standard drinks or equivalent per week   Drug use: No   Sexual activity: Yes    Partners: Male

## 2023-08-26 ENCOUNTER — Ambulatory Visit: Payer: BC Managed Care – PPO | Admitting: Orthopaedic Surgery

## 2023-09-16 ENCOUNTER — Ambulatory Visit: Payer: BC Managed Care – PPO

## 2023-12-14 ENCOUNTER — Other Ambulatory Visit: Payer: Self-pay | Admitting: Family

## 2023-12-28 ENCOUNTER — Telehealth: Payer: Self-pay | Admitting: Orthopaedic Surgery

## 2023-12-28 NOTE — Telephone Encounter (Signed)
 Received call from patient requesting records to go to [facility]. I advised she needs to complete authorization. I emailed to her per her request at [email or web address]. ph [phone]

## 2024-03-03 HISTORY — PX: KNEE SURGERY: SHX244

## 2024-05-11 ENCOUNTER — Encounter: Payer: Self-pay | Admitting: Family

## 2024-05-11 NOTE — Telephone Encounter (Signed)
 Initial Comment Caller states she has reoccurring hives on her arms for a month so far, it's 5 times. each time it happens, it gets worse and they get bigger. it started at her wrist, but now its reaching up to her elbow. it's on both arms. She can't figure out if it's food doing it or something environmentally. She states she had surgery 2 months got on 03/03/24. Translation No Nurse Assessment Nurse: Arthea, RN, Clarita Date/Time Titus Time): 05/11/2024 1:29:50 PM Confirm and document reason for call. If symptomatic, describe symptoms. ---Caller states she has reoccurring hives on her arms for a month so far, it's 5 times. each time it happens, it gets worse and they get bigger. it started at her wrist, but now its reaching up to her elbow. it's on both arms. She can't figure out if it's food doing it or something environmentally. She states she had surgery 2 months got on 03/03/24 to right knee. Does the patient have any new or worsening symptoms? ---Yes Will a triage be completed? ---Yes Related visit to physician within the last 2 weeks? ---No Does the PT have any chronic conditions? (i.e. diabetes, asthma, this includes High risk factors for pregnancy, etc.) ---Yes List chronic conditions. ---knee surgery May 2025, insomnia, BCP, GERD, B12, folic acid , MVI, NSAIDs, Is the patient pregnant or possibly pregnant? (Ask all females between the ages of 52-55) ---No Is this a behavioral health or substance abuse call? ---No PLEASE NOTE: All timestamps contained within this report are represented as [place] Standard Time. CONFIDENTIALTY NOTICE: This fax transmission is intended only for the addressee. It contains information that is legally privileged, confidential or otherwise protected from use or disclosure. If you are not the intended recipient, you are strictly prohibited from reviewing, disclosing, copying using or disseminating any of this information or taking any action in  reliance on or regarding this information. If you have received this fax in error, please notify us  immediately by telephone so that we can arrange for its return to us . Phone: [phone], Toll-Free: [phone], Fax: [phone] HEATHER_BAIN [birth year] Page: 1 of2 CallId: [id number] Guidelines Guideline Title Affirmed Question Affirmed Notes Nurse Date/Time Titus Time) Hives [1] Hives has occurred 3 or more times in the last year AND [2] the cause was not found Arthea, Acupuncturist 05/11/2024 1:33:13 PM Disp. Time Titus Time) Disposition Final User 05/11/2024 1:37:31 PM See PCP within 2 Weeks Yes Arthea, RN, Clarita Final Disposition 05/11/2024 1:37:31 PM See PCP within 2 Weeks Yes Arthea, RN, Clarita [place] Disagree/Comply Comply Caller Understands Yes PreDisposition Home Care Care Advice Given Per Guideline SEE PCP WITHIN 2 WEEKS: * You need to be seen for this ongoing problem within the next 2 weeks. * PCP VISIT: Call your doctor (or NP/PA) during regular office hours and make an appointment. ANTIHISTAMINE MEDICINES FOR WIDESPREAD HIVES: * For widespread hives, you can take cetirizine, fexofendadine, or loratadine . They can help reduce the rash and any itching. * They are over-the-counter (OTC) antihistamine medicines. You can buy them at a drugstore or grocery store. * CETIRIZINE (REACTINE, ZYRTEC): The adult dose is 10 mg. You take it once a day. Cetirizine is available in the [place]  as Zyrtec and in [place] as Reactine. COOL BATH FOR ITCHING: * Take a cool bath for 10 minutes to relieve itching. (Caution: avoid any chill). * Rub very itchy areas with an ice cube for 10 minutes. CALL BACK IF: * You become worse CARE ADVICE given per Hives (Adult) guideline. Comments User: Clarita,  Arthea, RN Date/Time Titus Time): 05/11/2024 1:36:31 PM Appt is Tuesday. User: Clarita Arthea, RN Date/Time Titus Time): 05/11/2024 1:39:05 PM use caution w/benadryl and a  sleeping pill. UV

## 2024-05-11 NOTE — Telephone Encounter (Signed)
 Appt scheduled 05/16/24

## 2024-05-15 ENCOUNTER — Other Ambulatory Visit: Payer: Self-pay | Admitting: Family

## 2024-05-15 DIAGNOSIS — G4700 Insomnia, unspecified: Secondary | ICD-10-CM

## 2024-05-15 NOTE — Progress Notes (Signed)
 This encounter was created in error - please disregard.

## 2024-05-16 ENCOUNTER — Encounter: Payer: Self-pay | Admitting: Family

## 2024-05-16 ENCOUNTER — Ambulatory Visit (INDEPENDENT_AMBULATORY_CARE_PROVIDER_SITE_OTHER): Admitting: Family

## 2024-05-16 VITALS — BP 125/63 | HR 91 | Temp 98.9°F | Resp 12 | Ht 65.0 in | Wt 179.0 lb

## 2024-05-16 DIAGNOSIS — L509 Urticaria, unspecified: Secondary | ICD-10-CM | POA: Diagnosis not present

## 2024-05-16 MED ORDER — CETIRIZINE HCL 10 MG PO TABS: 10.0000 mg | ORAL_TABLET | Freq: Every day | ORAL | 11 refills | Status: AC

## 2024-05-16 NOTE — Progress Notes (Unsigned)
 Subjective:     Patient ID: Tiffany Lyons, female    DOB: 04-05-1986, 38 y.o.   MRN: [id number]  Chief Complaint  Patient presents with   Urticaria    HPI  Discussed the use of AI scribe software for clinical note transcription with the patient, who gave verbal consent to proceed.  History of Present Illness  Tiffany Lyons is a 38 year old female who presents to discuss recurrent hives. She is accompanied today by her husband.  Over the past month, she experiences recurrent hives that appear intermittently and resolve within 24 hours. Initially small, the hives have worsened, with the most recent episode extending to her elbow. They are intensely pruritic, especially when she is hot, and occur in both sun-exposed areas and under clothing.  The hives began after returning to work following knee surgery. She has not identified any environmental or dietary changes, maintaining the same shampoo, body wash, and detergent. She initially suspected poison ivy from her dog. Benadryl was taken during the first episode, which she thought might be related to a meal at [employer]. She feels stressed since returning to work, which she associates with being behind schedule. There has been no tongue or lip swelling.     Health Maintenance Due  Topic Date Due   HPV VACCINES (1 - 3-dose SCDM series) Never done   INFLUENZA VACCINE  05/12/2024    Past Medical History:  Diagnosis Date   Depression    Family history of colon cancer    Gallstones    Genetic testing 11/25/2016   Test Results: No pathogenic mutations detected.  Genes Analyzed: 43 genes on [facility]'s Common Cancers panel (APC, ATM, AXIN2, BARD1, BMPR1A, BRCA1, BRCA2, BRIP1, CDH1, CDKN2A, CHEK2, DICER1, EPCAM, GREM1, HOXB13, KIT, MEN1, MLH1, MSH2, MSH6, MUTYH, NBN, NF1, PALB2, PDGFRA, PMS2, POLD1, POLE, PTEN, RAD50, RAD51C, RAD51D, SDHA, SDHB, SDHC, SDHD, SMAD4, SMARCA4, STK11, TP53, TSC1, TSC2, VHL).   History of kidney stones    Vitamin  B12 deficiency     Past Surgical History:  Procedure Laterality Date   CHOLECYSTECTOMY  10/17/2012   lap chole   COLONOSCOPY  02/2017   will need repeat every 5 yrs per pt.   KNEE SURGERY Right 03/03/2024   meniscus repair/arthropasty   WISDOM TOOTH EXTRACTION      Family History  Problem Relation Age of Onset   Arthritis Mother    Hemophilia Mother    Irritable bowel syndrome Mother    Colon cancer Father        first diagnosed at age 88, symptoms at 4   Alcohol abuse Father    Alcohol abuse Brother    Drug abuse Brother    Acute myelogenous leukemia Maternal Grandfather    Diabetes Paternal Grandfather    Colon cancer Paternal Grandfather        Dx 84s; currently 94   Heart disease Paternal Grandfather    Hearing loss Paternal Grandfather        sudden hearing loss on e ear   Scoliosis Half-Sister    Colon cancer Other        pat grandfather's mother   Cancer Other        several siblings of paternal grandfather; unk. types of cancer    Social History   Socioeconomic History   Marital status: Married    Spouse name: Not on file   Number of children: 1   Years of education: Not on file   Highest education level: GED or  equivalent  Occupational History   Occupation: custodian, bus driver  Tobacco Use   Smoking status: Former    Current packs/day: 0.00    Types: Cigarettes    Quit date: 07/31/2013    Years since quitting: 10.8   Smokeless tobacco: Never   Tobacco comments:    Using vapor cigarettes.  Vaping Use   Vaping status: Some Days  Substance and Sexual Activity   Alcohol use: Yes    Alcohol/week: 3.0 standard drinks of alcohol    Types: 3 Standard drinks or equivalent per week   Drug use: No   Sexual activity: Yes    Partners: Male  Other Topics Concern   Not on file  Social History Narrative   Works for [employer]      Patient is Married      She has a son - born 2008      She completed HS- enjoys reading,  crocheting            Social Drivers of Corporate investment banker Strain: Low Risk  (05/15/2024)   Overall Financial Resource Strain (CARDIA)    Difficulty of Paying Living Expenses: Not very hard  Food Insecurity: No Food Insecurity (05/15/2024)   Hunger Vital Sign    Worried About Running Out of Food in the Last Year: Never true    Ran Out of Food in the Last Year: Never true  Transportation Needs: No Transportation Needs (05/15/2024)   PRAPARE - Administrator, Civil Service (Medical): No    Lack of Transportation (Non-Medical): No  Physical Activity: Inactive (05/15/2024)   Exercise Vital Sign    Days of Exercise per Week: 0 days    Minutes of Exercise per Session: Not on file  Stress: No Stress Concern Present (05/15/2024)   [employer] of Occupational Health - Occupational Stress Questionnaire    Feeling of Stress: Not at all  Social Connections: Moderately Integrated (05/15/2024)   Social Connection and Isolation Panel    Frequency of Communication with Friends and Family: More than three times a week    Frequency of Social Gatherings with Friends and Family: Once a week    Attends Religious Services: Never    Database administrator or Organizations: Yes    Attends Engineer, structural: More than 4 times per year    Marital Status: Married  Catering manager Violence: Not on file    Outpatient Medications Prior to Visit  Medication Sig Dispense Refill   amitriptyline  (ELAVIL ) 50 MG tablet TAKE 1 TABLET BY MOUTH AT BEDTIME 90 tablet 0   cyanocobalamin  (VITAMIN B12) 1000 MCG tablet Take 1 tablet (1,000 mcg total) by mouth daily.     folic acid  (FOLVITE ) 1 MG tablet Take 1 tablet (1 mg total) by mouth daily.     Multiple Vitamins-Minerals (MULTIVITAMIN WITH MINERALS) tablet Take 1 tablet by mouth daily.     norethindrone-ethinyl estradiol -iron (LARIN FE 1.5/30) 1.5-30 MG-MCG tablet Take 1 tablet by mouth daily. 84 tablet 4   pantoprazole  (PROTONIX ) 40 MG  tablet Take 1 tablet (40 mg total) by mouth daily. 90 tablet 0   nabumetone (RELAFEN) 750 MG tablet Take 750 mg by mouth 2 (two) times daily.     No facility-administered medications prior to visit.    No Known Allergies  ROS See HPI    Objective:    Physical Exam Constitutional:      General: She is not in acute distress.  Appearance: Normal appearance. She is well-developed.  HENT:     Head: Normocephalic and atraumatic.     Comments: No tongue or lip swelling noted    Right Ear: External ear normal.     Left Ear: External ear normal.  Eyes:     General: No scleral icterus. Neck:     Thyroid: No thyromegaly.  Cardiovascular:     Rate and Rhythm: Normal rate and regular rhythm.     Heart sounds: Normal heart sounds. No murmur heard. Pulmonary:     Effort: Pulmonary effort is normal. No respiratory distress.     Breath sounds: Normal breath sounds. No wheezing.  Musculoskeletal:     Cervical back: Neck supple.  Skin:    General: Skin is warm and dry.     Findings: Rash is not urticarial.  Neurological:     Mental Status: She is alert and oriented to person, place, and time.  Psychiatric:        Mood and Affect: Mood normal.        Behavior: Behavior normal.        Thought Content: Thought content normal.        Judgment: Judgment normal.      BP 125/63 (BP Location: Right Arm, Patient Position: Sitting, Cuff Size: Normal)   Pulse 91   Temp 98.9 F (37.2 C) (Oral)   Resp 12   Ht 5' 5 (1.651 m)   Wt 179 lb (81.2 kg)   LMP 05/09/2024   SpO2 100%   BMI 29.79 kg/m  Wt Readings from Last 3 Encounters:  05/16/24 179 lb (81.2 kg)  08/17/23 187 lb (84.8 kg)  07/16/23 181 lb (82.1 kg)       Assessment & Plan:   Problem List Items Addressed This Visit       Unprioritized   Urticaria - Primary    Intermittent hives, no clear trigger, possibly idiopathic. Consider environmental or immune factors. Stress may contribute. - Recommend Zyrtec  once daily or  Claritin  as alternative. - Refer to allergist for evaluation. Off antihistamines seven days prior to testing. - Consider adding Pepcid if hives persist despite Zyrtec .      Relevant Medications   cetirizine  (ZYRTEC ) 10 MG tablet   Other Relevant Orders   Ambulatory referral to Allergy    I am having Powell Lennice Powell Buysse start on cetirizine . I am also having her maintain her multivitamin with minerals, cyanocobalamin , folic acid , norethindrone-ethinyl estradiol -iron, pantoprazole , amitriptyline , and nabumetone.  Meds ordered this encounter  Medications   cetirizine  (ZYRTEC ) 10 MG tablet    Sig: Take 1 tablet (10 mg total) by mouth daily.    Dispense:  30 tablet    Refill:  11    Supervising Provider:   DOMENICA BLACKBIRD A [phone]

## 2024-05-17 DIAGNOSIS — L509 Urticaria, unspecified: Secondary | ICD-10-CM | POA: Insufficient documentation

## 2024-05-17 NOTE — Assessment & Plan Note (Signed)
  Intermittent hives, no clear trigger, possibly idiopathic. Consider environmental or immune factors. Stress may contribute. - Recommend Zyrtec  once daily or Claritin  as alternative. - Refer to allergist for evaluation. Off antihistamines seven days prior to testing. - Consider adding Pepcid if hives persist despite Zyrtec .

## 2024-05-17 NOTE — Patient Instructions (Signed)
 VISIT SUMMARY:  Today, we discussed your recurrent hives and insomnia. We have a plan to manage your symptoms and will refer you to a specialist for further evaluation.  YOUR PLAN:  CHRONIC IDIOPATHIC URTICARIA (RECURRENT HIVES): You have been experiencing recurrent hives with no clear trigger, which may be related to environmental or immune factors. Stress might also be contributing. -Take Zyrtec  once daily. If you prefer, you can take Claritin  instead. -We will refer you to an allergist for further evaluation. Please stop taking antihistamines seven days before your appointment with the allergist. -If the hives persist despite taking Zyrtec , we may consider adding Pepcid to your treatment.  INSOMNIA: Your insomnia, which was previously managed with amitriptyline , has returned after discontinuing the medication due to post-surgery pain management. -We will refill your prescription for amitriptyline  to help manage your insomnia.

## 2024-07-27 ENCOUNTER — Other Ambulatory Visit: Payer: Self-pay | Admitting: Family

## 2024-08-13 ENCOUNTER — Other Ambulatory Visit: Payer: Self-pay | Admitting: Family

## 2024-08-13 DIAGNOSIS — G4700 Insomnia, unspecified: Secondary | ICD-10-CM

## 2024-08-14 ENCOUNTER — Encounter: Payer: Self-pay | Admitting: Radiology

## 2024-08-14 ENCOUNTER — Other Ambulatory Visit: Payer: Self-pay | Admitting: Family

## 2024-11-11 ENCOUNTER — Other Ambulatory Visit: Payer: Self-pay | Admitting: Family
